# Patient Record
Sex: Female | Born: 1995 | Race: White | Hispanic: No | Marital: Single | Smoking: Never smoker
Health system: Southern US, Community
[De-identification: ages and names within clinical notes are randomized; demographics above are authoritative.]

## PROBLEM LIST (undated history)

## (undated) DIAGNOSIS — N200 Calculus of kidney: Secondary | ICD-10-CM

## (undated) DIAGNOSIS — J302 Other seasonal allergic rhinitis: Secondary | ICD-10-CM

## (undated) DIAGNOSIS — Z8619 Personal history of other infectious and parasitic diseases: Secondary | ICD-10-CM

## (undated) DIAGNOSIS — R519 Headache, unspecified: Secondary | ICD-10-CM

## (undated) DIAGNOSIS — N838 Other noninflammatory disorders of ovary, fallopian tube and broad ligament: Secondary | ICD-10-CM

## (undated) DIAGNOSIS — R1900 Intra-abdominal and pelvic swelling, mass and lump, unspecified site: Secondary | ICD-10-CM

## (undated) DIAGNOSIS — R55 Syncope and collapse: Secondary | ICD-10-CM

## (undated) DIAGNOSIS — R51 Headache: Secondary | ICD-10-CM

## (undated) HISTORY — DX: Other seasonal allergic rhinitis: J30.2

## (undated) HISTORY — DX: Syncope and collapse: R55

## (undated) HISTORY — PX: OTHER SURGICAL HISTORY: SHX169

## (undated) HISTORY — DX: Calculus of kidney: N20.0

## (undated) HISTORY — DX: Personal history of other infectious and parasitic diseases: Z86.19

## (undated) HISTORY — DX: Intra-abdominal and pelvic swelling, mass and lump, unspecified site: R19.00

## (undated) HISTORY — DX: Headache, unspecified: R51.9

## (undated) HISTORY — DX: Headache: R51

## (undated) HISTORY — PX: TYMPANOSTOMY TUBE PLACEMENT: SHX32

---
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
## 2016-01-12 DIAGNOSIS — M25532 Pain in left wrist: Secondary | ICD-10-CM | POA: Diagnosis not present

## 2016-04-05 ENCOUNTER — Encounter: Payer: Self-pay | Admitting: Family Medicine

## 2016-04-05 ENCOUNTER — Ambulatory Visit (INDEPENDENT_AMBULATORY_CARE_PROVIDER_SITE_OTHER): Payer: BLUE CROSS/BLUE SHIELD | Admitting: Family Medicine

## 2016-04-05 VITALS — BP 100/64 | HR 90 | Temp 98.5°F | Ht 66.0 in | Wt 165.0 lb

## 2016-04-05 DIAGNOSIS — S6992XD Unspecified injury of left wrist, hand and finger(s), subsequent encounter: Secondary | ICD-10-CM | POA: Diagnosis not present

## 2016-04-05 DIAGNOSIS — S6982XD Other specified injuries of left wrist, hand and finger(s), subsequent encounter: Secondary | ICD-10-CM | POA: Diagnosis not present
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
## 2016-04-05 NOTE — Progress Notes (Signed)
Pre visit review using our clinic review tool, if applicable. No additional management support is needed unless otherwise documented below in the visit note. 

## 2016-04-05 NOTE — Patient Instructions (Signed)

## 2016-04-10 ENCOUNTER — Ambulatory Visit: Payer: BLUE CROSS/BLUE SHIELD | Attending: Family Medicine | Admitting: Occupational Therapy

## 2016-04-10 DIAGNOSIS — M25632 Stiffness of left wrist, not elsewhere classified: Secondary | ICD-10-CM

## 2016-04-10 DIAGNOSIS — M79642 Pain in left hand: Secondary | ICD-10-CM | POA: Diagnosis not present

## 2016-04-10 DIAGNOSIS — M6281 Muscle weakness (generalized): Secondary | ICD-10-CM

## 2016-04-10 DIAGNOSIS — M25642 Stiffness of left hand, not elsewhere classified: Secondary | ICD-10-CM

## 2016-04-10 DIAGNOSIS — M25532 Pain in left wrist: Secondary | ICD-10-CM | POA: Diagnosis not present

## 2016-04-10 NOTE — Patient Instructions (Signed)
Contrast done  2-3 x day  Prefab thumb spica wear - and soft neoprene only when ADL's and on computer   AROM for thumb in all planes  Pain free range - RD and UD on table  Flexion and extention without gravity   Thumb PA and RA  Opposition  AROM pain free
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
## 2016-04-17 ENCOUNTER — Ambulatory Visit: Payer: BLUE CROSS/BLUE SHIELD | Attending: Family Medicine | Admitting: Occupational Therapy

## 2016-04-17 DIAGNOSIS — M25632 Stiffness of left wrist, not elsewhere classified: Secondary | ICD-10-CM | POA: Diagnosis not present

## 2016-04-17 DIAGNOSIS — M25642 Stiffness of left hand, not elsewhere classified: Secondary | ICD-10-CM | POA: Diagnosis not present

## 2016-04-17 DIAGNOSIS — M25532 Pain in left wrist: Secondary | ICD-10-CM | POA: Diagnosis not present

## 2016-04-17 DIAGNOSIS — M79642 Pain in left hand: Secondary | ICD-10-CM | POA: Diagnosis not present

## 2016-04-17 DIAGNOSIS — M6281 Muscle weakness (generalized): Secondary | ICD-10-CM

## 2016-04-17 NOTE — Patient Instructions (Addendum)
Add self range of motion/ PROM for pt to do at home UD, prayer stretch for wrist extention and wrist flexion over edge of table  10 reps keep pain under 3/10  Cont with contrast and splint wearing  And AROM

## 2016-04-17 NOTE — Therapy (Signed)
[facility]IC[facility] [address] [place], [place], [address] Phone: [phone]   Fax:  [phone]  Occupational Therapy Treatment  Patient Details  Name: Lori Blake MRN: [id number] Date of Birth: 1996-01-30 Referring Provider: Copland  Encounter Date: 04/17/2016      OT End of Session - 04/17/16 1519    Visit Number 2   Number of Visits 8   Date for OT Re-Evaluation 05/08/16   OT Start Time 1358   OT Stop Time 1445   OT Time Calculation (min) 47 min   Activity Tolerance Patient tolerated treatment well   Behavior During Therapy [facility] for tasks assessed/performed      Past Medical History:  Diagnosis Date  . Frequent headaches   . History of chicken pox   . History of RSV infection    as infant  . Kidney stones 2013  . Pelvic mass in female 2010   fallopian tube mass removed left side  . Seasonal allergies   . Syncopal episodes 2016   vasovagal    Past Surgical History:  Procedure Laterality Date  . LAPAROSCOPIC REMOVAL ABDOMINAL MASS Left 2010   paratubular - fallopian tube  . Nexplanon     insertion and removal  . TONSILLECTOMY  2006  . TYMPANOSTOMY TUBE PLACEMENT  1999; 2006   second surgery was permanent ear tubes    There were no vitals filed for this visit.      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 Yes   Pain Score 4    Pain Orientation Left   Pain Descriptors / Indicators Aching                      OT Treatments/Exercises (OP) - 04/17/16 0001      LUE Fluidotherapy   Number Minutes Fluidotherapy 10 Minutes   LUE Fluidotherapy Location Hand;Wrist   [facility] pain and increase  ROM at wrist and thumb       ROM assess and palpation - tender over 1st dorsal compartment and TFCC  Pain with UD and thumb - mostly at webspace and up in 2nd digit  Not 1st dorsal compartment  As well as pain increase with compression at wrist  AROM same as last time with pain 4/10 -   fluido therapy with AROM of wrist in all planes  Pain same - AROM increase 5 degrees  Was able to do AAROM and PROM by OT with increase pain for wrist UD,RD, flexion and extention  10 reps   Did some Graston tool nr 2 over volar wrist and forearm - tightness volar wrist  With some redness - and palm at base of thumb - sweeping and scooping  Increase wrist extention afterwards  Add self range of motion/ PROM for pt to do at home UD, prayer stretch for wrist extention and wrist flexion over edge of table  10 reps keep pain under 3/10  Cont with contrast and splint wearing              OT Education - 04/17/16 1519  Education provided Yes   Education Details add some PROM    Person(s) Educated Patient   Methods Explanation;Demonstration;Tactile cues;Verbal cues;Handout   Comprehension Verbal cues required;Returned demonstration;Verbalized understanding          OT Short Term Goals - 04/10/16 1615      OT SHORT TERM GOAL #1   Title Pain on PRWHE improve with at least 20 points in L wrist and hand   Baseline Pain eval 33/50 on PRWH   Time 3   Period Weeks   Status New     OT SHORT TERM GOAL #2   Title AROM for L wrist improve to same than R wrist to use in ADL's and typing without increase pain    Baseline L wrist flexion 48, ext 53, RD 25, UD 18    Time 4   Period Weeks   Status New     OT SHORT TERM GOAL #3   Title L thumb AROM improve to WNL without increase symptoms - to use in typing and buttons    Baseline thumb pain increase with RA and flexion    Time 3   Period Weeks   Status New           OT Long Term Goals - 04/10/16 1620      OT LONG TERM GOAL #1   Title  Initiate strengthening to L wrist in all planes with out increase pain or symptoms    Baseline pain at rest 6/10 and at the worse increase to 8/10    Time 4   Period Weeks   Status New               Plan - 04/17/16 1520    Clinical Impression Statement Pain is decrease this date - ROM same but was able to add some PROM to wrist without increase pain - add to HEP - still waiting for appt with ortho   Rehab Potential Good   OT Frequency 2x / week   OT Duration 4 weeks   OT Treatment/Interventions Self-care/ADL training;Fluidtherapy;Splinting;Patient/family education;Therapeutic exercises;Contrast Bath;Ultrasound;Iontophoresis;Parrafin;Manual Therapy;Passive range of motion   Plan assess progress with HEP , pain and if appt with ortho yet   OT Home Exercise Plan see pt instruction   Consulted and Agree with Plan of Care Patient      Patient will benefit from skilled therapeutic intervention in order to improve the following deficits and impairments:  Decreased range of motion, Impaired flexibility, Pain, Impaired UE functional use, Decreased strength  Visit Diagnosis: Pain in left hand  Pain in left wrist  Muscle weakness (generalized)  Stiffness of left wrist, not elsewhere classified  Stiffness of left hand, not elsewhere classified    Problem List Patient Active Problem List   Diagnosis Date Noted  . Left wrist injury 10/22/2015  . Left flank pain 10/22/2015  . Recurrent acute suppurative otitis media without spontaneous rupture of tympanic membrane of both sides 01/07/2015  . 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 Seasonal allergies   . Frequent headaches   . Pelvic mass in female     Lori Blake,Lori Blake 04/17/2016, 3:21 PM  [facility] PH[facility]TS MEDICINE [address], [place], [address] Phone: [phone]   Fax:  [phone]  Name: Lori Blake MRN:  [id number] Date of Birth: Jul 23, 1995

## 2016-04-20 ENCOUNTER — Ambulatory Visit: Payer: BLUE CROSS/BLUE SHIELD | Admitting: Occupational Therapy

## 2016-04-20 DIAGNOSIS — M25642 Stiffness of left hand, not elsewhere classified: Secondary | ICD-10-CM | POA: Diagnosis not present

## 2016-04-20 DIAGNOSIS — M25632 Stiffness of left wrist, not elsewhere classified: Secondary | ICD-10-CM | POA: Diagnosis not present

## 2016-04-20 DIAGNOSIS — M79642 Pain in left hand: Secondary | ICD-10-CM | POA: Diagnosis not present

## 2016-04-20 DIAGNOSIS — M25532 Pain in left wrist: Secondary | ICD-10-CM | POA: Diagnosis not present

## 2016-04-20 DIAGNOSIS — M6281 Muscle weakness (generalized): Secondary | ICD-10-CM | POA: Diagnosis not present

## 2016-04-20 NOTE — Patient Instructions (Signed)
Contrast - splint wearing  AROM pain free range for wrist in all planes Biofreeze 4 x day

## 2016-04-20 NOTE — Therapy (Signed)
[facility] [address] [place], [place], [address] Phone: [phone]   Fax:  [phone]  Occupational Therapy Treatment  Patient Details  Name: Lori Blake MRN: [id number] Date of Birth: 1995/12/19 Referring Provider: Patsy Lageropland  Encounter Date: 04/20/2016      OT End of Session - 04/20/16 1901    Visit Number 3   Number of Visits 8   Date for OT Re-Evaluation 05/08/16   OT Start Time 1804   OT Stop Time 1840   OT Time Calculation (min) 36 min   Activity Tolerance Patient tolerated treatment well   Behavior During Therapy [facility] for tasks assessed/performed      Past Medical History:  Diagnosis Date  . Frequent headaches   . History of chicken pox   . History of RSV infection    as infant  . Kidney stones 2013  . Pelvic mass in female 2010   fallopian tube mass removed left side  . Seasonal allergies   . Syncopal episodes 2016   vasovagal    Past Surgical History:  Procedure Laterality Date  . LAPAROSCOPIC REMOVAL ABDOMINAL MASS Left 2010   paratubular - fallopian tube  . Nexplanon     insertion and removal  . TONSILLECTOMY  2006  . TYMPANOSTOMY TUBE PLACEMENT  1999; 2006   second surgery was permanent ear tubes    There were no vitals filed for this visit.      Subjective Assessment - 04/20/16 1835    Subjective  Did the exercises - was hurting last night and tonight too - pain mostly on the bottom of my wrist - appt with Dr Tyna Jakschrtmann Tues 20th   Patient Stated Goals Want my wrist and hand to be like it was before the accident    Currently in Pain? Yes   Pain Score 7    Pain Location Wrist   Pain Orientation Left   Pain Descriptors / Indicators Aching;Shooting   Pain Type Acute pain   Pain Onset More than a month ago            [facility] OT Assessment - 04/20/16 0001      AROM   Left Wrist Extension 53 Degrees   Left Wrist Flexion 44 Degrees   Left Wrist Radial Deviation 30  Degrees   Left Wrist Ulnar Deviation 20 Degrees      Assess progress in AROM at wrist - tender and pain mostly on volar wrist and palm - done contrast and pt to cont with only AROM pain free range until appt with MD  Cont with thumb spica             OT Treatments/Exercises (OP) - 04/20/16 0001      LUE Contrast Bath   Time 11 minutes   Comments decrease pain after assessment or ROM - pain decrease to 4/10                OT Education - 04/20/16 1900    Education provided Yes   Education Details findings - and use of biofreeze - to only do AROM for wrist pain free range    Person(s) Educated Patient   Methods Explanation;Demonstration;Tactile cues   Comprehension Returned demonstration;Verbalized understanding          OT Short Term Goals - 04/10/16 1615      OT SHORT TERM GOAL #1   Title Pain on PRWHE improve with at least 20 points in L  wrist and hand   Baseline Pain eval 33/50 on PRWH   Time 3   Period Weeks   Status New     OT SHORT TERM GOAL #2   Title AROM for L wrist improve to same than R wrist to use in ADL's and typing without increase pain    Baseline L wrist flexion 48, ext 53, RD 25, UD 18    Time 4   Period Weeks   Status New     OT SHORT TERM GOAL #3   Title L thumb AROM improve to WNL without increase symptoms - to use in typing and buttons    Baseline thumb pain increase with RA and flexion    Time 3   Period Weeks   Status New           OT Long Term Goals - 04/10/16 1620      OT LONG TERM GOAL #1   Title Initiate strengthening to L wrist in all planes with out increase pain or symptoms    Baseline pain at rest 6/10 and at the worse increase to 8/10    Time 4   Period Weeks   Status New               Plan - 04/20/16 1901    Clinical Impression Statement Pt pain cont to be the same - with use of thumb spica and use of modalities - fluido, contrast - AROM pain free range - ROM did not improve - pt can use  biofreeze if needed and cont with splint and AROM pain free range until appt with MD  on 20th    Rehab Potential Good   OT Frequency 2x / week   OT Duration 4 weeks   OT Treatment/Interventions Self-care/ADL training;Fluidtherapy;Splinting;Patient/family education;Therapeutic exercises;Contrast Bath;Ultrasound;Iontophoresis;Parrafin;Manual Therapy;Passive range of motion   Plan Pt to contact after MD visit   OT Home Exercise Plan see pt instruction   Consulted and Agree with Plan of Care Patient      Patient will benefit from skilled therapeutic intervention in order to improve the following deficits and impairments:  Decreased range of motion, Impaired flexibility, Pain, Impaired UE functional use, Decreased strength  Visit Diagnosis: Pain in left hand  Pain in left wrist  Muscle weakness (generalized)  Stiffness of left wrist, not elsewhere classified  Stiffness of left hand, not elsewhere classified    Problem List Patient Active Problem List   Diagnosis Date Noted  . Left wrist injury 10/22/2015  . Left flank pain 10/22/2015  . Recurrent acute suppurative otitis media without spontaneous rupture of tympanic membrane of both sides 01/07/2015  . 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 Seasonal allergies   . Frequent headaches   . Pelvic mass in female     Oletta Cohn OTR/L,CLT 04/20/2016, 7:03 PM  [facility] [address], [place], [address] Phone: [phone]   Fax:  [phone]  Name: Lori Blake MRN: [id number] Date of Birth: [birth year]

## 2016-04-24 ENCOUNTER — Ambulatory Visit: Payer: BLUE CROSS/BLUE SHIELD | Admitting: Occupational Therapy

## 2016-04-27 ENCOUNTER — Encounter: Payer: BLUE CROSS/BLUE SHIELD | Admitting: Occupational Therapy

## 2016-04-30 DIAGNOSIS — H9201 Otalgia, right ear: Secondary | ICD-10-CM | POA: Diagnosis not present

## 2016-05-01 DIAGNOSIS — H66003 Acute suppurative otitis media without spontaneous rupture of ear drum, bilateral: Secondary | ICD-10-CM | POA: Diagnosis not present

## 2016-05-01 DIAGNOSIS — H6983 Other specified disorders of Eustachian tube, bilateral: Secondary | ICD-10-CM | POA: Diagnosis not present

## 2016-05-02 DIAGNOSIS — S63592A Other specified sprain of left wrist, initial encounter: Secondary | ICD-10-CM | POA: Diagnosis not present

## 2016-05-04 DIAGNOSIS — Z01419 Encounter for gynecological examination (general) (routine) without abnormal findings: Secondary | ICD-10-CM | POA: Diagnosis not present

## 2016-05-25 DIAGNOSIS — S63592A Other specified sprain of left wrist, initial encounter: Secondary | ICD-10-CM | POA: Diagnosis not present

## 2016-05-25 DIAGNOSIS — M659 Synovitis and tenosynovitis, unspecified: Secondary | ICD-10-CM | POA: Diagnosis not present

## 2016-05-25 DIAGNOSIS — M24832 Other specific joint derangements of left wrist, not elsewhere classified: Secondary | ICD-10-CM | POA: Diagnosis not present

## 2016-05-25 DIAGNOSIS — M24132 Other articular cartilage disorders, left wrist: Secondary | ICD-10-CM | POA: Diagnosis not present

## 2016-05-25 HISTORY — PX: WRIST SURGERY: SHX841

## 2016-05-30 ENCOUNTER — Ambulatory Visit: Payer: Self-pay | Admitting: Medical

## 2016-05-30 ENCOUNTER — Encounter: Payer: Self-pay | Admitting: Medical

## 2016-05-30 VITALS — BP 115/65 | HR 89 | Temp 98.6°F | Resp 16 | Ht 66.0 in | Wt 160.0 lb

## 2016-05-30 DIAGNOSIS — H6502 Acute serous otitis media, left ear: Secondary | ICD-10-CM

## 2016-05-30 MED ORDER — AZITHROMYCIN 250 MG PO TABS: ORAL_TABLET | ORAL | 0 refills | Status: DC

## 2016-05-30 NOTE — Progress Notes (Signed)
   Subjective:    Patient ID: Lori Blake, female    DOB: 1995-05-21, 21 y.o.   MRN: [id number]  HPI  21 yo old female started on Sunday with the feeling of fullnes and intermittent pain ( on pain killers for for your left wrist surgery 5 days ago , hydrocodone) in left ear. Left ear drained for x 20min , has not drained since. Now just painful and full feeling. Right ear also started feeling full as wel but no painl. Ran out of flonase last Thursday or Friday. Seen by ENT  Feb 19th placed on antibiotc eardrops and possibly a steroid pill or antibioitc patient not sure. Has tubes in ears. Patient states that last ear infection in  Feb 2018 was in her right ear only.    Review of Systems  Constitutional: Negative for chills and fever.  HENT: Positive for congestion, ear discharge and ear pain. Negative for hearing loss, postnasal drip, rhinorrhea, sinus pain, sore throat and tinnitus.   Eyes: Negative.   Respiratory: Negative.   Cardiovascular: Negative.   Neurological: Negative.        Objective:   Physical Exam  Constitutional: She appears well-developed and well-nourished.  HENT:  Head: Normocephalic and atraumatic.  Eyes: EOM are normal. Pupils are equal, round, and reactive to light.  Neck: Normal range of motion. Neck supple.  Cardiovascular: Normal rate, regular rhythm and normal heart sounds.    Left ear looks red with tube in place , no discharge noted. Right ear slightly dull with tube in place, no discharge noted.       Assessment & Plan:  Serous otitis media left ear with recent treatment by her ENT. Will try azithromycin 250mg  two tablets by mouth day 1 then one tablet days 2-5 , take with food. If worsening pain or pain does not improve to see her ENT doctor.  Return to the clinic as needed. Readjusted patient sling so left hand would be more elevated, fingers swollen, color within normal limits.

## 2016-05-30 NOTE — Patient Instructions (Signed)
Follow with your ENT doctor in 3-5 days if pain not improving , sooner If worsening pain. Avoid getting water into ears. Return to the clinic as needed.

## 2016-06-12 DIAGNOSIS — S63592D Other specified sprain of left wrist, subsequent encounter: Secondary | ICD-10-CM | POA: Diagnosis not present

## 2016-06-12 DIAGNOSIS — Z4789 Encounter for other orthopedic aftercare: Secondary | ICD-10-CM | POA: Diagnosis not present

## 2016-06-30 DIAGNOSIS — S63592D Other specified sprain of left wrist, subsequent encounter: Secondary | ICD-10-CM | POA: Diagnosis not present

## 2016-09-01 ENCOUNTER — Telehealth: Payer: Self-pay

## 2016-09-01 NOTE — Telephone Encounter (Signed)
Pt states she was in a few months ago & it was decided she would continue her OCP but skip her placebo pills. For the first few months, it was fine, but now she is spotting pretty heavily. Pt inquiring if she should discontinue & have a period now then restart new pack.

## 2016-09-01 NOTE — Telephone Encounter (Signed)
Spoke with patient regarding her birth control. She is going to continue taking only active pills and see what happens with next pack. She has been having spotting for a couple of days. She does admit increased stress currently.

## 2016-09-06 NOTE — Telephone Encounter (Signed)
Pt calling back.  She's been taking BC for a few months but doesn't take placebos so she doesn't have a period.  Last wk she started spotting then Fri she started bleeding.  It's been on and off since then -  Consistent bleed then stops then bleeds more every day.  She is still on active pills which she will finish this weekend.  Next week is placebo week.  Not sure if she should take active pills or take placebo pills and see if after that it gets fixed.  Not supposed to be bleeding.  Not sure what to do.  [phone]

## 2016-09-07 ENCOUNTER — Other Ambulatory Visit: Payer: Self-pay | Admitting: Advanced Practice Midwife

## 2016-09-07 NOTE — Telephone Encounter (Signed)
Spoke with patient and discussed options. Patient prefers to continue with this birth control and take placebo pills. She will let me know if she decides to switch pills.

## 2016-09-11 ENCOUNTER — Ambulatory Visit: Payer: BLUE CROSS/BLUE SHIELD | Admitting: Occupational Therapy

## 2016-09-12 ENCOUNTER — Ambulatory Visit: Payer: BLUE CROSS/BLUE SHIELD | Attending: Orthopedic Surgery | Admitting: Occupational Therapy

## 2016-09-12 DIAGNOSIS — M25642 Stiffness of left hand, not elsewhere classified: Secondary | ICD-10-CM | POA: Diagnosis not present

## 2016-09-12 DIAGNOSIS — M25632 Stiffness of left wrist, not elsewhere classified: Secondary | ICD-10-CM | POA: Insufficient documentation

## 2016-09-12 DIAGNOSIS — M25532 Pain in left wrist: Secondary | ICD-10-CM | POA: Insufficient documentation

## 2016-09-12 DIAGNOSIS — M6281 Muscle weakness (generalized): Secondary | ICD-10-CM | POA: Insufficient documentation

## 2016-09-12 DIAGNOSIS — M79642 Pain in left hand: Secondary | ICD-10-CM | POA: Insufficient documentation

## 2016-09-12 NOTE — Therapy (Signed)
[facility] [address], [place], [address] Phone: [phone]   Fax:  [phone]  Occupational Therapy Evaluation  Patient Details  Name: Lori Blake MRN: [id number] Date of Birth: July 07, 1995 Referring Provider: Melvyn Novas  Encounter Date: 09/12/2016      OT End of Session - 09/12/16 1529    Visit Number 1   Number of Visits 12   Date for OT Re-Evaluation 10/24/16   OT Start Time 1304   OT Stop Time 1349   OT Time Calculation (min) 45 min   Activity Tolerance Patient tolerated treatment well   Behavior During Therapy [facility] for tasks assessed/performed      Past Medical History:  Diagnosis Date  . Frequent headaches   . History of chicken pox   . History of RSV infection    as infant  . Kidney stones 2013  . Pelvic mass in female 2010   fallopian tube mass removed left side  . Seasonal allergies   . Syncopal episodes 2016   vasovagal    Past Surgical History:  Procedure Laterality Date  . LAPAROSCOPIC REMOVAL ABDOMINAL MASS Left 2010   paratubular - fallopian tube  . Nexplanon     insertion and removal  . TONSILLECTOMY  2006  . TYMPANOSTOMY TUBE PLACEMENT  1999; 2006   second surgery was permanent ear tubes    There were no vitals filed for this visit.      Subjective Assessment - 09/12/16 1514    Subjective  I had surgery on tear ligament at wrist April 15th - and then he took my last cast/brace off in June and refer to therapy - was busy until now - some pain with lifting ,pushing , pulling or twisting   Patient Stated Goals I want to be albe to use my hand and wrist like before the accident    Currently in Pain? No/denies           [facility] OT Assessment - 09/12/16 0001      Assessment   Diagnosis L TFCC repair, lunotriquetral tear   Referring Provider Melvyn Novas   Onset Date 05/25/16     Home  Environment   Lives With Friend(s)     Prior Function   Vocation Student   Leisure Landscape architect , likes to kayak, babysit and volunteer at sports      AROM   Right Wrist Extension 80 Degrees   Right Wrist Flexion 93 Degrees   Right Wrist Radial Deviation 25 Degrees   Right Wrist Ulnar Deviation 37 Degrees   Left Wrist Extension 47 Degrees   Left Wrist Flexion 55 Degrees   Left Wrist Radial Deviation 30 Degrees   Left Wrist Ulnar Deviation 20 Degrees     Strength   Right Hand Grip (lbs) 60   Right Hand Lateral Pinch 11 lbs   Right Hand 3 Point Pinch 11 lbs   Left Hand Grip (lbs) 30   Left Hand Lateral Pinch 6 lbs   Left Hand 3 Point Pinch 9 lbs        Fluidotherapy done prior to review of HEP - AROM for wrist in all planes done inside heat   HEP review and hand out provided   PROM for wrist flexion, ext, RD , UD  AROM in all planes  Teal putty for gripping , 3 point grip  12 reps  2-3 x day  No pain more than 1-2/10 and slight pull  OT Education - 09/12/16 1528    Education provided Yes   Education Details Results of eval , Homeprogram    Person(s) Educated Patient   Methods Explanation;Demonstration;Tactile cues;Verbal cues;Handout   Comprehension Verbal cues required;Returned demonstration;Verbalized understanding          OT Short Term Goals - 09/12/16 1534      OT SHORT TERM GOAL #1   Title Pain on PRWHE improve with at least 12  points in L wrist and hand   Baseline Pain eval 16/50 on PRWHE   Time 3   Period Weeks   Status New     OT SHORT TERM GOAL #2   Title AROM for L wrist improve to same than R wrist to use in ADL's and typing without increase pain    Baseline see flowsheet    Time 4   Period Weeks   Status New           OT Long Term Goals - 09/12/16 1536      OT LONG TERM GOAL #1   Title L wrist strength increase for pt to push up from chair, carry more than 8 lbs , turn doorknob ,pull or push chair without increase symptoms    Baseline pain 6/10 with pulling , twisting , pushing   -do not carry things in L more using R hand     Time 5   Period Weeks   Status New     OT LONG TERM GOAL #2   Title L grip and prehension strength increase to 75% compare to R to use hand in IADL's and ADL's without symptoms    Baseline Grip 60 R ; L 30 ; Lat grip R 11, L 6 lbs    Time 5   Period Weeks   Status New     OT LONG TERM GOAL #3   Title Function score on PRWHE improve with more than 5 lbs to carry 10 lbs, push up from chair, do sports without increase symptoms    Baseline Function score at eval  8.5/50    Time 6   Period Weeks   Status New               Plan - 09/12/16 1529    Clinical Impression Statement Pt present at eval more than 3 months out from L TFCC repair and lunotriquetral tear - pt out of splint or cast since June - pt show decrease ROM in wrist flexion, ext, RD , UD - and grip and prehension about 50% compare to R - pt report increase pian to 6/10 mostly with lifting ,pulling , pushing and twisting    Occupational performance deficits (Please refer to evaluation for details): ADL's;IADL's;Play;Leisure   Rehab Potential Good   OT Frequency Other (comment)  1-2 x wk   OT Duration 6 weeks   OT Treatment/Interventions Self-care/ADL training;Fluidtherapy;Patient/family education;Therapeutic exercises;Passive range of motion;Manual Therapy;Parrafin   Plan Assess progress with HEP    Clinical Decision Making Limited treatment options, no task modification necessary   OT Home Exercise Plan see pt instruction   Consulted and Agree with Plan of Care Patient      Patient will benefit from skilled therapeutic intervention in order to improve the following deficits and impairments:  Decreased range of motion, Impaired flexibility, Decreased strength, Impaired UE functional use, Pain  Visit Diagnosis: Pain in left wrist - Plan: Ot plan of care cert/re-cert  Stiffness of left wrist, not elsewhere classified - Plan: Ot  plan of care cert/re-cert  Muscle  weakness (generalized) - Plan: Ot plan of care cert/re-cert    Problem List Patient Active Problem List   Diagnosis Date Noted  . Left wrist injury 10/22/2015  . Left flank pain 10/22/2015  . Recurrent acute suppurative otitis media without spontaneous rupture of tympanic membrane of both sides 01/07/2015  . 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 Seasonal allergies   . Frequent headaches   . Pelvic mass in female     Oletta Cohn OTR/L,CLT 09/12/2016, 3:45 PM  [facility] [address], [place], [address] Phone: [phone]   Fax:  [phone]  Name: KANAE IGNATOWSKI MRN: [id number] Date of Birth: 1995/10/09

## 2016-09-12 NOTE — Patient Instructions (Signed)
Heat  PROM for wrist flexion, ext, RD , UD  AROM in all planes  Teal putty for gripping , 3 point grip  12 reps  2-3 x day  No pain more than 1-2/10 and slight pull

## 2016-09-15 ENCOUNTER — Ambulatory Visit: Payer: BLUE CROSS/BLUE SHIELD | Admitting: Occupational Therapy

## 2016-09-15 DIAGNOSIS — M25632 Stiffness of left wrist, not elsewhere classified: Secondary | ICD-10-CM | POA: Diagnosis not present

## 2016-09-15 DIAGNOSIS — M6281 Muscle weakness (generalized): Secondary | ICD-10-CM

## 2016-09-15 DIAGNOSIS — M79642 Pain in left hand: Secondary | ICD-10-CM | POA: Diagnosis not present

## 2016-09-15 DIAGNOSIS — M25532 Pain in left wrist: Secondary | ICD-10-CM

## 2016-09-15 DIAGNOSIS — M25642 Stiffness of left hand, not elsewhere classified: Secondary | ICD-10-CM | POA: Diagnosis not present

## 2016-09-15 NOTE — Therapy (Signed)
[facility] [address], [place], [address] Phone: [phone]   Fax:  [phone]  Occupational Therapy Treatment  Patient Details  Name: Lori Blake MRN: [id number] Date of Birth: April 21, 1995 Referring Provider: Melvyn Novas  Encounter Date: 09/15/2016      OT End of Session - 09/15/16 1543    Visit Number 2   Number of Visits 12   Date for OT Re-Evaluation 10/24/16   OT Start Time 1330   OT Stop Time 1401   OT Time Calculation (min) 31 min   Activity Tolerance Patient tolerated treatment well   Behavior During Therapy [facility] for tasks assessed/performed      Past Medical History:  Diagnosis Date  . Frequent headaches   . History of chicken pox   . History of RSV infection    as infant  . Kidney stones 2013  . Pelvic mass in female 2010   fallopian tube mass removed left side  . Seasonal allergies   . Syncopal episodes 2016   vasovagal    Past Surgical History:  Procedure Laterality Date  . LAPAROSCOPIC REMOVAL ABDOMINAL MASS Left 2010   paratubular - fallopian tube  . Nexplanon     insertion and removal  . TONSILLECTOMY  2006  . TYMPANOSTOMY TUBE PLACEMENT  1999; 2006   second surgery was permanent ear tubes    There were no vitals filed for this visit.      Subjective Assessment - 09/15/16 1531    Subjective  I was doing my exercises - doing okay - little more pain after it for few min than I had before I started exercises    Patient Stated Goals I want to be albe to use my hand and wrist like before the accident                       OT Treatments/Exercises (OP) - 09/15/16 0001      LUE Fluidotherapy   Number Minutes Fluidotherapy --   LUE Fluidotherapy Location Hand;Wrist  AROM for wrist in all planes to increase ROM and decreasepai      Measurements taken See flow sheet   Fluidotherapy  AROM for wrist in all planes - as SOC    Reinforce for pt to feel  slight pull and not more than 1-2/10 pain  Only use one finger for PROM wrist flexion - showed great progress since 3 days ago PROM for wrist flexion, ext, RD , UD  Review needed mod A   AROM in all planes  Teal putty for gripping , 3 point grip  12 reps  2-3 x day  No pain more than 1-2/10 and only  slight pull              OT Education - 09/15/16 1543    Education provided Yes   Education Details reinforce to keep at slight pull or stretch    Person(s) Educated Patient   Methods Explanation;Demonstration;Tactile cues;Verbal cues;Handout   Comprehension Verbal cues required;Returned demonstration;Verbalized understanding          OT Short Term Goals - 09/12/16 1534      OT SHORT TERM GOAL #1   Title Pain on PRWHE improve with at least 12  points in L wrist and hand   Baseline Pain eval 16/50 on PRWHE   Time 3   Period Weeks   Status New     OT SHORT TERM GOAL #  2   Title AROM for L wrist improve to same than R wrist to use in ADL's and typing without increase pain    Baseline see flowsheet    Time 4   Period Weeks   Status New           OT Long Term Goals - 09/12/16 1536      OT LONG TERM GOAL #1   Title L wrist strength increase for pt to push up from chair, carry more than 8 lbs , turn doorknob ,pull or push chair without increase symptoms    Baseline pain 6/10 with pulling , twisting , pushing  -do not carry things in L more using R hand     Time 5   Period Weeks   Status New     OT LONG TERM GOAL #2   Title L grip and prehension strength increase to 75% compare to R to use hand in IADL's and ADL's without symptoms    Baseline Grip 60 R ; L 30 ; Lat grip R 11, L 6 lbs    Time 5   Period Weeks   Status New     OT LONG TERM GOAL #3   Title Function score on PRWHE improve with more than 5 lbs to carry 10 lbs, push up from chair, do sports without increase symptoms    Baseline Function score at eval  8.5/50    Time 6   Period Weeks   Status New                Plan - 09/15/16 1544    Clinical Impression Statement Pt showed great progress in ROM for wrist since only 3 days ago - pt to keep HEP at gentle or slight pull - and less than 1-2/10 pain - cont with same HEP    Occupational performance deficits (Please refer to evaluation for details): ADL's;IADL's;Play;Leisure   Rehab Potential Good   OT Frequency 1x / week   OT Duration 4 weeks   OT Treatment/Interventions Self-care/ADL training;Fluidtherapy;Patient/family education;Therapeutic exercises;Passive range of motion;Manual Therapy;Parrafin   Plan assess if can start 1 lbs and upgrade putty    OT Home Exercise Plan see pt instruction   Consulted and Agree with Plan of Care Patient      Patient will benefit from skilled therapeutic intervention in order to improve the following deficits and impairments:  Decreased range of motion, Impaired flexibility, Decreased strength, Impaired UE functional use, Pain  Visit Diagnosis: Pain in left wrist  Stiffness of left wrist, not elsewhere classified  Muscle weakness (generalized)    Problem List Patient Active Problem List   Diagnosis Date Noted  . Left wrist injury 10/22/2015  . Left flank pain 10/22/2015  . Recurrent acute suppurative otitis media without spontaneous rupture of tympanic membrane of both sides 01/07/2015  . 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 Seasonal allergies   . Frequent headaches   . Pelvic mass in female     Oletta CohnDuPreez, Airica Schwartzkopf OTR/L,CLT 09/15/2016, 3:46 PM  [facility] [address] [place], [place], [address] Phone: [phone]   Fax:  [phone]  Name: Lori Blake MRN: [id number] Date of Birth: 05-19-1995

## 2016-09-15 NOTE — Patient Instructions (Addendum)
Same HEP  

## 2016-09-18 ENCOUNTER — Ambulatory Visit: Payer: BLUE CROSS/BLUE SHIELD | Admitting: Occupational Therapy

## 2016-09-18 DIAGNOSIS — M25532 Pain in left wrist: Secondary | ICD-10-CM | POA: Diagnosis not present

## 2016-09-18 DIAGNOSIS — M6281 Muscle weakness (generalized): Secondary | ICD-10-CM

## 2016-09-18 DIAGNOSIS — M25642 Stiffness of left hand, not elsewhere classified: Secondary | ICD-10-CM | POA: Diagnosis not present

## 2016-09-18 DIAGNOSIS — M25632 Stiffness of left wrist, not elsewhere classified: Secondary | ICD-10-CM

## 2016-09-18 DIAGNOSIS — M79642 Pain in left hand: Secondary | ICD-10-CM | POA: Diagnosis not present

## 2016-09-18 NOTE — Patient Instructions (Addendum)
Cont with PROM  16oz hammer wrist in all planes 12 reps  Pain free  Can increase to 2 sets - 2  X day - in 3 days if no pain  And 3 sets in 6 days if not increase pain   Upgrade to green putty for gripping , 3 point grip  12 reps  2 x day  No pain more than 1-2/10 and only  slight pull  Can do same upgrade for reps as 16 oz hammer

## 2016-09-18 NOTE — Therapy (Signed)
[facility] [address], [place], [address] Phone: [phone]   Fax:  [phone]  Occupational Therapy Treatment  Patient Details  Name: Lori Blake MRN: [id number] Date of Birth: 05-05-1995 Referring Provider: Melvyn Novas  Encounter Date: 09/18/2016      OT End of Session - 09/18/16 1213    Visit Number 3   Number of Visits 12   Date for OT Re-Evaluation 10/24/16   OT Start Time 0918   OT Stop Time 0951   OT Time Calculation (min) 33 min   Activity Tolerance Patient tolerated treatment well   Behavior During Therapy [facility] for tasks assessed/performed      Past Medical History:  Diagnosis Date  . Frequent headaches   . History of chicken pox   . History of RSV infection    as infant  . Kidney stones 2013  . Pelvic mass in female 2010   fallopian tube mass removed left side  . Seasonal allergies   . Syncopal episodes 2016   vasovagal    Past Surgical History:  Procedure Laterality Date  . LAPAROSCOPIC REMOVAL ABDOMINAL MASS Left 2010   paratubular - fallopian tube  . Nexplanon     insertion and removal  . TONSILLECTOMY  2006  . TYMPANOSTOMY TUBE PLACEMENT  1999; 2006   second surgery was permanent ear tubes    There were no vitals filed for this visit.      Subjective Assessment - 09/18/16 1037    Subjective  Did not had pain - exercises did okay -I [place] - but my dad drove   Patient Stated Goals I want to be albe to use my hand and wrist like before the accident    Currently in Pain? No/denies            [facility] OT Assessment - 09/18/16 0001      AROM   Left Wrist Extension 58 Degrees   Left Wrist Flexion 75 Degrees     Strength   Right Hand Lateral Pinch 15 lbs   Right Hand 3 Point Pinch 13 lbs   Left Hand Grip (lbs) 34   Left Hand Lateral Pinch 14 lbs   Left Hand 3 Point Pinch 11 lbs                  OT  Treatments/Exercises (OP) - 09/18/16 0001      LUE Fluidotherapy   Number Minutes Fluidotherapy 10 Minutes   LUE Fluidotherapy Location Hand;Wrist  AROM at [facility] to increase flexin and extention       Measurements taken for wrist ext and flexion - grip/prehension  See flow sheet   Fluidotherapy  AROM for wrist in all planes - as SOC    Reinforce for pt to only  feel slight pull and not more than 1-2/10 pain   PROM for wrist flexion, ext  16oz hammer wrist in all planes 12 reps  Pain free  Can increase to 2 sets - 2  X day - in 3 days if no pain  And 3 sets in 6 days if not increase pain   Upgrade to green putty for gripping , 3 point grip  12 reps  2 x day  No pain more than 1-2/10 and only  slight pull  Can do same upgrade for reps as 16 oz hammer  OT Education - 09/18/16 1213    Education provided Yes   Education Details upgrade and add Clinical cytogeneticist) Educated Patient   Methods Explanation;Demonstration;Tactile cues;Verbal cues;Handout   Comprehension Verbal cues required;Returned demonstration;Verbalized understanding          OT Short Term Goals - 09/12/16 1534      OT SHORT TERM GOAL #1   Title Pain on PRWHE improve with at least 12  points in L wrist and hand   Baseline Pain eval 16/50 on PRWHE   Time 3   Period Weeks   Status New     OT SHORT TERM GOAL #2   Title AROM for L wrist improve to same than R wrist to use in ADL's and typing without increase pain    Baseline see flowsheet    Time 4   Period Weeks   Status New           OT Long Term Goals - 09/12/16 1536      OT LONG TERM GOAL #1   Title L wrist strength increase for pt to push up from chair, carry more than 8 lbs , turn doorknob ,pull or push chair without increase symptoms    Baseline pain 6/10 with pulling , twisting , pushing  -do not carry things in L more using R hand     Time 5   Period Weeks   Status New     OT LONG TERM GOAL #2   Title L grip and  prehension strength increase to 75% compare to R to use hand in IADL's and ADL's without symptoms    Baseline Grip 60 R ; L 30 ; Lat grip R 11, L 6 lbs    Time 5   Period Weeks   Status New     OT LONG TERM GOAL #3   Title Function score on PRWHE improve with more than 5 lbs to carry 10 lbs, push up from chair, do sports without increase symptoms    Baseline Function score at eval  8.5/50    Time 6   Period Weeks   Status New               Plan - 09/18/16 1214    Clinical Impression Statement Pt cont to show progress in ROM , strength and  decrease pain - and increase use - add weight for wrist and upgrade putty for grip - cont to progress    Occupational performance deficits (Please refer to evaluation for details): ADL's;IADL's;Play;Leisure   Rehab Potential Good   OT Frequency 1x / week   OT Duration 4 weeks   OT Treatment/Interventions Self-care/ADL training;Fluidtherapy;Patient/family education;Therapeutic exercises;Passive range of motion;Manual Therapy;Parrafin   Plan upgrade to 2 lbs if possible and add new putty HEP    Clinical Decision Making Limited treatment options, no task modification necessary   OT Home Exercise Plan see pt instruction   Consulted and Agree with Plan of Care Patient      Patient will benefit from skilled therapeutic intervention in order to improve the following deficits and impairments:  Decreased range of motion, Impaired flexibility, Decreased strength, Impaired UE functional use, Pain  Visit Diagnosis: Stiffness of left wrist, not elsewhere classified  Pain in left wrist  Muscle weakness (generalized)    Problem List Patient Active Problem List   Diagnosis Date Noted  . Left wrist injury 10/22/2015  . Left flank pain 10/22/2015  . Recurrent acute suppurative otitis media without  spontaneous rupture of tympanic membrane of both sides 01/07/2015  . 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 Seasonal  allergies   . Frequent headaches   . Pelvic mass in female     Oletta CohnDuPreez, Lilienne Weins OTR/L,CLT 09/18/2016, 12:18 PM  [facility] [address] [place], [place], [address] Phone: [phone]   Fax:  [phone]  Name: Lori Blake MRN: [id number] Date of Birth: 1995-09-24

## 2016-09-27 ENCOUNTER — Ambulatory Visit: Payer: BLUE CROSS/BLUE SHIELD | Admitting: Occupational Therapy

## 2016-09-27 DIAGNOSIS — M25532 Pain in left wrist: Secondary | ICD-10-CM | POA: Diagnosis not present

## 2016-09-27 DIAGNOSIS — M25642 Stiffness of left hand, not elsewhere classified: Secondary | ICD-10-CM

## 2016-09-27 DIAGNOSIS — M6281 Muscle weakness (generalized): Secondary | ICD-10-CM

## 2016-09-27 DIAGNOSIS — M79642 Pain in left hand: Secondary | ICD-10-CM

## 2016-09-27 DIAGNOSIS — M25632 Stiffness of left wrist, not elsewhere classified: Secondary | ICD-10-CM

## 2016-09-27 NOTE — Therapy (Signed)
[facility] [address] [place], [place], [address] Phone: [phone]   Fax:  [phone]  Occupational Therapy Treatment  Patient Details  Name: Lori Blake MRN: [id number] Date of Birth: Jun 07, 1995 Referring Provider: Melvyn Novasrtmann  Encounter Date: 09/27/2016      OT End of Session - 09/27/16 0939    Visit Number 4   Number of Visits 12   Date for OT Re-Evaluation 10/24/16   OT Start Time 0916   OT Stop Time 1000   OT Time Calculation (min) 44 min   Activity Tolerance Patient tolerated treatment well   Behavior During Therapy [facility] for tasks assessed/performed      Past Medical History:  Diagnosis Date  . Frequent headaches   . History of chicken pox   . History of RSV infection    as infant  . Kidney stones 2013  . Pelvic mass in female 2010   fallopian tube mass removed left side  . Seasonal allergies   . Syncopal episodes 2016   vasovagal    Past Surgical History:  Procedure Laterality Date  . LAPAROSCOPIC REMOVAL ABDOMINAL MASS Left 2010   paratubular - fallopian tube  . Nexplanon     insertion and removal  . TONSILLECTOMY  2006  . TYMPANOSTOMY TUBE PLACEMENT  1999; 2006   second surgery was permanent ear tubes    There were no vitals filed for this visit.      Subjective Assessment - 09/27/16 0923    Subjective  My wrist pain is good- using it more- still some pain at the top of wrist for flexoin stretch - doing okay with weight and putty -still cannot push me up   Patient Stated Goals I want to be albe to use my hand and wrist like before the accident    Currently in Pain? No/denies            [facility] - 09/27/16 0001      AROM   Left Wrist Extension 65 Degrees   Left Wrist Flexion 83 Degrees     Strength   Right Hand Grip (lbs) 60   Right Hand Lateral Pinch 15 lbs   Right Hand 3 Point Pinch 13 lbs   Left Hand Grip (lbs) 49   Left Hand Lateral Pinch 16 lbs   Left Hand 3 Point Pinch 12 lbs                  OT Treatments/Exercises (OP) - 09/27/16 0001      LUE Fluidotherapy   Number Minutes Fluidotherapy 10 Minutes   LUE Fluidotherapy Location Hand;Wrist  AROM for flexion and extention prior to ther ex      Measurements taken for wrist ext and flexion - grip/prehension  Great progress See flow sheet  Fluidotherapy AROM for wrist in all planes - as SOC   Reinforce for pt to only  feel slight pull and not more than 1-2/10 pain   PROM for wrist flexion, ext  Add table slide for wrist extention and pressure  2 lbs for  wrist in all planes  15 reps   Pain free  Can increase to 2 sets - 2  X day - in 3-4 days if no pain    Mix 1/2 dark blue firm into  green putty for gripping , 3 point grip , lat grip  12 reps  2 x day  No pain more than 1-2/10 and only  slight pull  Can increase to 2 sets in 3-4 days - and add pulling and twisting              OT Education - 09/27/16 0939    Education provided Yes   Education Details upgrade putty - and 2 lbs    Person(s) Educated Patient   Methods Explanation;Demonstration;Tactile cues;Verbal cues   Comprehension Verbal cues required;Returned demonstration;Verbalized understanding          OT Short Term Goals - 09/12/16 1534      OT SHORT TERM GOAL #1   Title Pain on PRWHE improve with at least 12  points in L wrist and hand   Baseline Pain eval 16/50 on PRWHE   Time 3   Period Weeks   Status New     OT SHORT TERM GOAL #2   Title AROM for L wrist improve to same than R wrist to use in ADL's and typing without increase pain    Baseline see flowsheet    Time 4   Period Weeks   Status New           OT Long Term Goals - 09/12/16 1536      OT LONG TERM GOAL #1   Title L wrist strength increase for pt to push up from chair, carry more than 8 lbs , turn doorknob ,pull or push chair without increase symptoms    Baseline pain 6/10 with pulling , twisting ,  pushing  -do not carry things in L more using R hand     Time 5   Period Weeks   Status New     OT LONG TERM GOAL #2   Title L grip and prehension strength increase to 75% compare to R to use hand in IADL's and ADL's without symptoms    Baseline Grip 60 R ; L 30 ; Lat grip R 11, L 6 lbs    Time 5   Period Weeks   Status New     OT LONG TERM GOAL #3   Title Function score on PRWHE improve with more than 5 lbs to carry 10 lbs, push up from chair, do sports without increase symptoms    Baseline Function score at eval  8.5/50    Time 6   Period Weeks   Status New               Plan - 09/27/16 1047    Clinical Impression Statement Pt cont to make great progress in ROM at wrist , grip and prehension greatly - and able to increase to 2 lbs for wrist - pt to cont with HEP and upgrade as informed    Occupational performance deficits (Please refer to evaluation for details): ADL's;IADL's;Play;Leisure   Rehab Potential Good   OT Frequency 1x / week   OT Duration 2 weeks   OT Treatment/Interventions Self-care/ADL training;Fluidtherapy;Patient/family education;Therapeutic exercises;Passive range of motion;Manual Therapy;Parrafin   Plan assess progress using 2 lbs mix putty    Clinical Decision Making Limited treatment options, no task modification necessary   OT Home Exercise Plan see pt instruction   Consulted and Agree with Plan of Care Patient      Patient will benefit from skilled therapeutic intervention in order to improve the following deficits and impairments:  Decreased range of motion, Impaired flexibility, Decreased strength, Impaired UE functional use, Pain  Visit Diagnosis: Stiffness of left wrist, not elsewhere classified  Pain in left wrist  Muscle weakness (generalized)  Pain  in left hand  Stiffness of left hand, not elsewhere classified    Problem List Patient Active Problem List   Diagnosis Date Noted  . Left wrist injury 10/22/2015  . Left flank pain  10/22/2015  . Recurrent acute suppurative otitis media without spontaneous rupture of tympanic membrane of both sides 01/07/2015  . 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 Seasonal allergies   . Frequent headaches   . Pelvic mass in female     Oletta Cohn OTR/L,CLT 09/27/2016, 1:30 PM  [facility] PHYSICAL AND SPORTS MEDICINE [address], [place], [address] Phone: [phone]   Fax:  [phone]  Name: Lori Blake MRN: [id number] Date of Birth: 05-15-1995

## 2016-09-27 NOTE — Patient Instructions (Addendum)
M  Reinforce for pt to only  feel slight pull and not more than 1-2/10 pain   Add table slide for wrist extention and pressure  2 lbs for  wrist in all planes upgrade 15 reps   Pain free  Can increase to 2 sets - 2  X day - in 3-4 days if no pain    Mix 1/2 dark blue firm into  green putty for gripping , 3 point grip , lat grip  12 reps  2 x day  No pain more than 1-2/10 and only slight pull  Can increase to 2 sets in 3-4 days - and add pulling and twisting

## 2016-10-03 ENCOUNTER — Ambulatory Visit: Payer: Self-pay | Admitting: Medical

## 2016-10-03 ENCOUNTER — Encounter: Payer: Self-pay | Admitting: Medical

## 2016-10-03 VITALS — BP 100/64 | HR 85 | Temp 97.6°F | Resp 16 | Ht 66.0 in | Wt 157.0 lb

## 2016-10-03 DIAGNOSIS — J0110 Acute frontal sinusitis, unspecified: Secondary | ICD-10-CM

## 2016-10-03 MED ORDER — AMOXICILLIN-POT CLAVULANATE 875-125 MG PO TABS: 1.0000 | ORAL_TABLET | Freq: Two times a day (BID) | ORAL | 0 refills | Status: DC

## 2016-10-03 NOTE — Progress Notes (Signed)
   Subjective:    Patient ID: Lori FleetAshley M Alomar, female    DOB: 05-31-1995, 21 y.o.   MRN: [id number]  HPI 21 yo female started 6 days ago with sore throat then  nasal congestion with PND cough. Runny nose and stuffy nose followed. Slept during the weekend thought it was a cold, got worse on Monday. Today sore throat and cough continues. Taking a OTC cold and sinus, didn't help, so tried OTC alternating Motrin and Tylenol. Cough dry , nothing comes up.  Wrist surgery on the left by Dr. Orlan Leavensrtman in March still in physical therapy, doing well so far.   Review of Systems  Constitutional: Positive for chills and fever.  HENT: Positive for congestion, postnasal drip, rhinorrhea, sinus pain, sinus pressure, sneezing, sore throat and voice change. Negative for ear pain and hearing loss.   Eyes: Negative for discharge and itching.  Respiratory: Positive for cough. Negative for shortness of breath and wheezing.   Cardiovascular: Negative for chest pain, palpitations and leg swelling.  Gastrointestinal: Negative for abdominal pain.  Endocrine: Negative for polydipsia, polyphagia and polyuria.  Genitourinary: Negative for dysuria and hematuria.  Musculoskeletal: Negative for back pain and myalgias.  Skin: Negative for rash.  Allergic/Immunologic: Positive for environmental allergies and food allergies.  Neurological: Positive for headaches. Negative for syncope and light-headedness.  Hematological: Negative for adenopathy.  Psychiatric/Behavioral: Negative for agitation, behavioral problems, confusion and hallucinations.   Pressure in ears bilaterally   forehead and maxillary sinus pressure and pain. Objective:   Physical Exam  Constitutional: She is oriented to person, place, and time. She appears well-developed and well-nourished.  HENT:  Head: Normocephalic and atraumatic.  Right Ear: External ear normal.  Left Ear: External ear normal.  Nose: Mucosal edema and rhinorrhea present. Right sinus  exhibits frontal sinus tenderness. Left sinus exhibits frontal sinus tenderness.  Mouth/Throat: Uvula is midline and oropharynx is clear and moist.  Eyes: Pupils are equal, round, and reactive to light. Conjunctivae and EOM are normal.  Neck: Normal range of motion. Neck supple.  Cardiovascular: Normal rate and regular rhythm.  Exam reveals no gallop and no friction rub.   No murmur heard. Pulmonary/Chest: Effort normal and breath sounds normal.  Neurological: She is alert and oriented to person, place, and time.  Skin: Skin is warm and dry.  Psychiatric: She has a normal mood and affect. Her behavior is normal. Judgment and thought content normal.  Nursing note and vitals reviewed.   Tubes in ears bilaterally      Assessment & Plan:  Sinusitis e prescribed Augmentin 875-125mg  one by mouth twice daily x 10 days #20 no refills , take with foodl Get OTC flonasse 2 sprays each notstril once a day for nasal congestion. Continue OTC Mortin or Tylenol as needed for headache, take as directed.  Recommended  OTC Zyrtec 10mg  one by mouth daily . Rest and drink plenty of water. Return in 3-5 days if not improving.

## 2016-10-03 NOTE — Patient Instructions (Signed)
Take Augmentin as prescribed.  Return to the clinic in k 3-5 days if not improving. Rest and drink plenty of water. Continue to use OTC Motrin and /or Tylenol for headaches, take as directed.    Sinusitis, Adult Sinusitis is soreness and inflammation of your sinuses. Sinuses are hollow spaces in the bones around your face. They are located:  Around your eyes.  In the middle of your forehead.  Behind your nose.  In your cheekbones.  Your sinuses and nasal passages are lined with a stringy fluid (mucus). Mucus normally drains out of your sinuses. When your nasal tissues get inflamed or swollen, the mucus can get trapped or blocked so air cannot flow through your sinuses. This lets bacteria, viruses, and funguses grow, and that leads to infection. Follow these instructions at home: Medicines  Take, use, or apply over-the-counter and prescription medicines only as told by your doctor. These may include nasal sprays.  If you were prescribed an antibiotic medicine, take it as told by your doctor. Do not stop taking the antibiotic even if you start to feel better. Hydrate and Humidify  Drink enough water to keep your pee (urine) clear or pale yellow.  Use a cool mist humidifier to keep the humidity level in your home above 50%.  Breathe in steam for 10-15 minutes, 3-4 times a day or as told by your doctor. You can do this in the bathroom while a hot shower is running.  Try not to spend time in cool or dry air. Rest  Rest as much as possible.  Sleep with your head raised (elevated).  Make sure to get enough sleep each night. General instructions  Put a warm, moist washcloth on your face 3-4 times a day or as told by your doctor. This will help with discomfort.  Wash your hands often with soap and water. If there is no soap and water, use hand sanitizer.  Do not smoke. Avoid being around people who are smoking (secondhand smoke).  Keep all follow-up visits as told by your  doctor. This is important. Contact a doctor if:  You have a fever.  Your symptoms get worse.  Your symptoms do not get better within 10 days. Get help right away if:  You have a very bad headache.  You cannot stop throwing up (vomiting).  You have pain or swelling around your face or eyes.  You have trouble seeing.  You feel confused.  Your neck is stiff.  You have trouble breathing. This information is not intended to replace advice given to you by your health care provider. Make sure you discuss any questions you have with your health care provider. Document Released: 08/16/2007 Document Revised: 10/24/2015 Document Reviewed: 12/23/2014 Elsevier Interactive Patient Education  [employer]2018 [employer]

## 2016-10-04 ENCOUNTER — Ambulatory Visit: Payer: BLUE CROSS/BLUE SHIELD | Admitting: Occupational Therapy

## 2016-10-04 DIAGNOSIS — M6281 Muscle weakness (generalized): Secondary | ICD-10-CM | POA: Diagnosis not present

## 2016-10-04 DIAGNOSIS — M25532 Pain in left wrist: Secondary | ICD-10-CM | POA: Diagnosis not present

## 2016-10-04 DIAGNOSIS — M25632 Stiffness of left wrist, not elsewhere classified: Secondary | ICD-10-CM | POA: Diagnosis not present

## 2016-10-04 DIAGNOSIS — M79642 Pain in left hand: Secondary | ICD-10-CM | POA: Diagnosis not present

## 2016-10-04 DIAGNOSIS — M25642 Stiffness of left hand, not elsewhere classified: Secondary | ICD-10-CM | POA: Diagnosis not present

## 2016-10-04 NOTE — Patient Instructions (Signed)
Same HEP but flexion stretch to wrist with loose fist  1 lbs for wrist flexion and extention  But cont 2 lbs for sup/pro, RD ,UD  But focus 3rd MC in line with midline of forearm during wrist extention  2 x day

## 2016-10-04 NOTE — Therapy (Signed)
[facility] [address] [place], [place], [address] Phone: [phone]   Fax:  [phone]  Occupational Therapy Treatment  Patient Details  Name: Lori Blake Turnbo MRN: [id number] Date of Birth: Apr 27, 1995 Referring Provider: Melvyn Novasrtmann  Encounter Date: 10/04/2016      OT End of Session - 10/04/16 1501    Visit Number 5   Number of Visits 12   Date for OT Re-Evaluation 10/24/16   OT Start Time 1144   OT Stop Time 1215   OT Time Calculation (min) 31 min   Activity Tolerance Patient tolerated treatment well   Behavior During Therapy [facility] for tasks assessed/performed      Past Medical History:  Diagnosis Date  . Frequent headaches   . History of chicken pox   . History of RSV infection    as infant  . Kidney stones 2013  . Pelvic mass in female 2010   fallopian tube mass removed left side  . Seasonal allergies   . Syncopal episodes 2016   vasovagal    Past Surgical History:  Procedure Laterality Date  . LAPAROSCOPIC REMOVAL ABDOMINAL MASS Left 2010   paratubular - fallopian tube  . Nexplanon     insertion and removal  . TONSILLECTOMY  2006  . TYMPANOSTOMY TUBE PLACEMENT  1999; 2006   second surgery was permanent ear tubes    There were no vitals filed for this visit.      Subjective Assessment - 10/04/16 1458    Subjective  Doing okay - no pain with using my hand - but some pain with 2 lbs wrist up and down - but then it goes away    Patient Stated Goals I want to be albe to use my hand and wrist like before the accident    Currently in Pain? No/denies                      OT Treatments/Exercises (OP) - 10/04/16 0001      LUE Fluidotherapy   Number Minutes Fluidotherapy 8 Minutes   LUE Fluidotherapy Location Hand;Wrist  AROM for wrist in all planes to increase ROM       Measurements taken for wrist ext and flexion -about the same  But increase after fluido  Fluidotherapy  AROM for wrist in all planes - as SOC   Reinforce for pt to only feel slight pull and not more than 1-2/10 pain   PROM for wrist flexion with hand in loose fist   extention PROM for wrist  Cont with  slide for wrist extentio 2 lbs for  sup/pro, RD and UD   but need t.c and v/c for hand in midline - 3 rd MC   15 reps   Pain free  Decrease wrist extention to end range isometric and then 1 lbs weight  12 reps   2 X day - in 3-4 days if no pain  increase to 2 lbs   Mix 1/2 dark blue firm into  green putty for gripping , 3 point grip , lat grip  12 reps  2 x day  No pain more than 1-2/10 and only slight pull  Can increase to 2 sets in 3-4 days - and add pulling and twisting        Same HEP but flexion stretch to wrist with loose fist  1 lbs for wrist flexion and extention  But cont 2 lbs for sup/pro, RD ,UD  But focus 3rd MC in line with midline of forearm during wrist extention  2 x day        OT Education - 10/04/16 1500    Education provided Yes   Education Details HEP modify  decrease weight for wrist extention   Person(s) Educated Patient   Methods Explanation;Demonstration;Tactile cues;Verbal cues   Comprehension Verbalized understanding;Returned demonstration;Verbal cues required          OT Short Term Goals - 09/12/16 1534      OT SHORT TERM GOAL #1   Title Pain on PRWHE improve with at least 12  points in L wrist and hand   Baseline Pain eval 16/50 on PRWHE   Time 3   Period Weeks   Status New     OT SHORT TERM GOAL #2   Title AROM for L wrist improve to same than R wrist to use in ADL's and typing without increase pain    Baseline see flowsheet    Time 4   Period Weeks   Status New           OT Long Term Goals - 09/12/16 1536      OT LONG TERM GOAL #1   Title L wrist strength increase for pt to push up from chair, carry more than 8 lbs , turn doorknob ,pull or push chair without increase symptoms    Baseline pain 6/10 with pulling  , twisting , pushing  -do not carry things in L more using R hand     Time 5   Period Weeks   Status New     OT LONG TERM GOAL #2   Title L grip and prehension strength increase to 75% compare to R to use hand in IADL's and ADL's without symptoms    Baseline Grip 60 R ; L 30 ; Lat grip R 11, L 6 lbs    Time 5   Period Weeks   Status New     OT LONG TERM GOAL #3   Title Function score on PRWHE improve with more than 5 lbs to carry 10 lbs, push up from chair, do sports without increase symptoms    Baseline Function score at eval  8.5/50    Time 6   Period Weeks   Status New               Plan - 10/04/16 1506    Clinical Impression Statement Pt making progress in strenght and functional use - had some pain with wrist ext, flexion with 2 lbs - decrease weight and add isometric end range - but pain decrease after HEP - pt to cont with HEP for 2 wks    Occupational performance deficits (Please refer to evaluation for details): ADL's;IADL's;Play;Leisure   Rehab Potential Good   OT Frequency 1x / week   OT Treatment/Interventions Self-care/ADL training;Fluidtherapy;Patient/family education;Therapeutic exercises;Passive range of motion;Manual Therapy;Parrafin   Plan assess progress in strenghth    Clinical Decision Making Limited treatment options, no task modification necessary   OT Home Exercise Plan see pt instruction   Consulted and Agree with Plan of Care Patient      Patient will benefit from skilled therapeutic intervention in order to improve the following deficits and impairments:  Decreased range of motion, Impaired flexibility, Decreased strength, Impaired UE functional use, Pain  Visit Diagnosis: Stiffness of left wrist, not elsewhere classified  Pain in left wrist  Muscle weakness (generalized)    Problem List Patient Active Problem List  Diagnosis Date Noted  . Left wrist injury 10/22/2015  . Left flank pain 10/22/2015  . Recurrent acute suppurative  otitis media without spontaneous rupture of tympanic membrane of both sides 01/07/2015  . 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 Seasonal allergies   . Frequent headaches   . Pelvic mass in female     Oletta CohnDuPreez, Mckinnon Glick OTR/L,CLT 10/04/2016, 3:08 PM  [facility] [address] [facility] [address] [place], [place], [address] Phone: [phone]   Fax:  [phone]  Name: Lori Blake Smigelski MRN: [id number] Date of Birth: 1995/04/21

## 2016-10-12 DIAGNOSIS — S63592D Other specified sprain of left wrist, subsequent encounter: Secondary | ICD-10-CM | POA: Diagnosis not present

## 2016-10-20 ENCOUNTER — Ambulatory Visit: Payer: BLUE CROSS/BLUE SHIELD | Attending: Orthopedic Surgery | Admitting: Occupational Therapy

## 2016-10-20 DIAGNOSIS — M25632 Stiffness of left wrist, not elsewhere classified: Secondary | ICD-10-CM

## 2016-10-20 DIAGNOSIS — M6281 Muscle weakness (generalized): Secondary | ICD-10-CM | POA: Insufficient documentation

## 2016-10-20 DIAGNOSIS — M25532 Pain in left wrist: Secondary | ICD-10-CM | POA: Insufficient documentation

## 2016-10-20 NOTE — Therapy (Signed)
[place] [facility] [address], [place], [address] Phone: [phone]   Fax:  [phone]  Occupational Therapy Treatment/discharge   Patient Details  Name: Lori Blake MRN: [id number] Date of Birth: 1996/02/06 Referring Provider: Caralyn Guile  Encounter Date: 10/20/2016      OT End of Session - 10/20/16 1403    Visit Number 6   Number of Visits 6   Date for OT Re-Evaluation 10/20/16   OT Start Time 0932   OT Stop Time 1012   OT Time Calculation (min) 40 min   Activity Tolerance Patient tolerated treatment well   Behavior During Therapy [facility] for tasks assessed/performed      Past Medical History:  Diagnosis Date  . Frequent headaches   . History of chicken pox   . History of RSV infection    as infant  . Kidney stones 2013  . Pelvic mass in female 2010   fallopian tube mass removed left side  . Seasonal allergies   . Syncopal episodes 2016   vasovagal    Past Surgical History:  Procedure Laterality Date  . LAPAROSCOPIC REMOVAL ABDOMINAL MASS Left 2010   paratubular - fallopian tube  . Nexplanon     insertion and removal  . TONSILLECTOMY  2006  . TYMPANOSTOMY TUBE PLACEMENT  1999; 2006   second surgery was permanent ear tubes    There were no vitals filed for this visit.      Subjective Assessment - 10/20/16 0936    Subjective  Seen Dr Caralyn Guile - happy progress- can cont at home - about 70-80% I am thinking - still only some pain when picking up something heavy with only L hand    Patient Stated Goals I want to be albe to use my hand and wrist like before the accident    Currently in Pain? No/denies            [facility] OT Assessment - 10/20/16 0001      AROM   Left Wrist Extension 70 Degrees     Strength   Right Hand Grip (lbs) 60   Right Hand Lateral Pinch 18 lbs   Right Hand 3 Point Pinch 15 lbs   Left Hand Grip (lbs) 52   Left Hand Lateral Pinch 16 lbs   Left Hand 3 Point Pinch  13.5 lbs      assess ment done and PRWHE for pain 11/50 and function 6.5/50   Review her putty HEP  To cont with and then upgrade her HEP for wrist to bands  +/-  Yellow about 2.5 lbs , Red 4 lbs , green 5 lbs  Done and add to HEP for pt to increase gradually resistance and sets    Change from 2 lbs for wrist  YTB for wrist in all planes - ed on HEP And can increase in few days -sets  And then resistance  To RTB   and then again later on to GTB  Also can do RTB starting out for elbow flexion and extention  And upgrade later to green   pt demo all in clinic - no pain   Pt demo and verbalize understanding for HEP and how to upgrade                      OT Education - 10/20/16 1403    Education provided Yes   Education Details HEP and discharge instructions    Person(s)  Educated Patient   Methods Explanation;Demonstration;Tactile cues;Verbal cues;Handout   Comprehension Verbal cues required;Returned demonstration;Verbalized understanding          OT Short Term Goals - 10/20/16 1405      OT SHORT TERM GOAL #1   Title Pain on PRWHE improve with at least 12  points in L wrist and hand   Baseline Pain eval 16/50 on PRWHE and now 11/50   Status Partially Met     OT SHORT TERM GOAL #2   Title AROM for L wrist improve to same than R wrist to use in ADL's and typing without increase pain    Baseline  still decrease in flexion and extention last 10 degrees - but no increase Pain with functional use    Status Partially Met     OT SHORT TERM GOAL #3   Title L thumb AROM improve to WNL without increase symptoms - to use in typing and buttons    Status Achieved           OT Long Term Goals - 10/20/16 1406      OT LONG TERM GOAL #1   Title L wrist strength increase for pt to push up from chair, carry more than 8 lbs , turn doorknob ,pull or push chair without increase symptoms    Baseline can do except lifting heavy objects - can carry them    Status  Achieved     OT LONG TERM GOAL #2   Title L grip and prehension strength increase to 75% compare to R to use hand in IADL's and ADL's without symptoms    Status Achieved     OT LONG TERM GOAL #3   Title Function score on PRWHE improve with more than 5 lbs to carry 10 lbs, push up from chair, do sports without increase symptoms    Baseline Function score at eval  8.5/50  and now 6.5 - can carry weight but not lift yet -    Status Partially Met               Plan - 10/20/16 1403    Clinical Impression Statement Pt made great progress in ROM , strength - still some pain but more with lifting heavy objecs - pt seen MD and following up in 2-3 months - can cont at home with graded HEP - pt ed on  HEP and to increase it on her own - with  increase resistance bands - pt discharge  with HEP    Occupational performance deficits (Please refer to evaluation for details): ADL's;IADL's;Play;Leisure   OT Treatment/Interventions Self-care/ADL training;Fluidtherapy;Patient/family education;Therapeutic exercises;Passive range of motion;Manual Therapy;Parrafin   Plan discharge with HEP    Clinical Decision Making Limited treatment options, no task modification necessary   OT Home Exercise Plan see pt instruction   Consulted and Agree with Plan of Care Patient      Patient will benefit from skilled therapeutic intervention in order to improve the following deficits and impairments:     Visit Diagnosis: Stiffness of left wrist, not elsewhere classified  Pain in left wrist  Muscle weakness (generalized)    Problem List Patient Active Problem List   Diagnosis Date Noted  . Left wrist injury 10/22/2015  . Left flank pain 10/22/2015  . Recurrent acute suppurative otitis media without spontaneous rupture of tympanic membrane of both sides 01/07/2015  . 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 Seasonal allergies   . Frequent headaches   .  Pelvic mass in female      Rosalyn Gess OTR/L,CLT 10/20/2016, 2:08 PM  [place] [facility] [address], [place], [address] Phone: [phone]   Fax:  [phone]  Name: Lori Blake MRN: [id number] Date of Birth: [birth year]

## 2016-10-20 NOTE — Patient Instructions (Signed)
Pt to cont with putty - mix for grip  Change from 2 lbs for wrist  YTB for wrist in all planes - ed on HEP for  All planes  And can increase in few days -if up to 2 sets  To RTB   and then again later on to GTB  Also can do RTB starting out for elbow flexion and extention  And upgrade later to green   pt demo all in clinic - no pain

## 2016-11-03 ENCOUNTER — Encounter: Payer: Self-pay | Admitting: Maternal Newborn

## 2016-11-03 ENCOUNTER — Ambulatory Visit (INDEPENDENT_AMBULATORY_CARE_PROVIDER_SITE_OTHER): Payer: BLUE CROSS/BLUE SHIELD | Admitting: Maternal Newborn

## 2016-11-03 VITALS — BP 120/80 | HR 88 | Ht 66.0 in | Wt 157.0 lb

## 2016-11-03 DIAGNOSIS — N760 Acute vaginitis: Secondary | ICD-10-CM | POA: Diagnosis not present

## 2016-11-03 LAB — POCT WET PREP (WET MOUNT)
CLUE CELLS WET PREP WHIFF POC: NEGATIVE
Trichomonas Wet Prep HPF POC: ABSENT

## 2016-11-03 MED ORDER — FLUCONAZOLE 150 MG PO TABS: 150.0000 mg | ORAL_TABLET | Freq: Once | ORAL | 1 refills | Status: AC

## 2016-11-03 NOTE — Patient Instructions (Signed)

## 2016-11-03 NOTE — Progress Notes (Signed)
Obstetrics & Gynecology Office Visit   Chief Complaint: Vaginal itching and irritation; vulvar irritation, itching and redness.   History of Present Illness: Ms. Lori Blake is a 21 y.o. No obstetric history on file. who LMP was Patient's last menstrual period was 09/01/2016., presents today for a problem visit.   Patient complains of an abnormal vaginal discharge for several days. Discharge described as: white, thick and odorless. Vaginal symptoms include discharge described as white and vulvar erythema noted, local irritation and vulvar itching.Vulvar symptoms include local irritation and vulvar itching.STI Risk: Very low risk of STD exposure.   Other associated symptoms: none.  Contraception: OCP (estrogen/progesterone).  She admits to recent antibiotic exposure, denies changes in soaps, detergents coinciding with the onset of her symptoms.  She has not previously self treated or been under treatment by another provider for these symptoms.    Review of Systems: Genito-Urinary ROS: positive for - vulvar/vaginal symptoms: itching, pain  Review of Systems  Constitutional: Negative.   All other systems reviewed and are negative.   Past Medical History:  Past Medical History:  Diagnosis Date  . Frequent headaches   . History of chicken pox   . History of RSV infection    as infant  . Kidney stones 2013  . Pelvic mass in female 2010   fallopian tube mass removed left side  . Seasonal allergies   . Syncopal episodes 2016   vasovagal    Past Surgical History:  Past Surgical History:  Procedure Laterality Date  . LAPAROSCOPIC REMOVAL ABDOMINAL MASS Left 2010   paratubular - fallopian tube  . Nexplanon     insertion and removal  . TONSILLECTOMY  2006  . TYMPANOSTOMY TUBE PLACEMENT  1999; 2006   second surgery was permanent ear tubes    Gynecologic History: Patient's last menstrual period was 09/01/2016.  Obstetric History: No obstetric history on file.  Family History:   Family History  Problem Relation Age of Onset  . Cancer Other        breast - multiple family members  . Melanoma Sister   . Cancer Paternal Grandmother        thyroid - and great grandfather  . CAD Maternal Grandfather   . Heart disease Paternal Grandfather   . Diabetes Paternal Grandfather   . Other Brother        hypoglycemia from hyperinsulinism  . Stroke Other        maternal great grandmother    Social History:  Social History   Social History  . Marital status: Single    Spouse name: N/A  . Number of children: N/A  . Years of education: N/A   Occupational History  . Not on file.   Social History Main Topics  . Smoking status: Never Smoker  . Smokeless tobacco: Never Used  . Alcohol use No  . Drug use: No  . Sexual activity: No   Other Topics Concern  . Not on file   Social History Narrative   [employer] freshman.    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    Allergies:  Allergies  Allergen Reactions  . Pediacare Children [Acetaminophen] Hives    ? Orange dye; Ok with tylenol  . Shellfish Allergy     Allergy at  21 yo and seafood was positive allergy.  . Versed [Midazolam] Other (See Comments)    Paradoxical reaction  Medications: Prior to Admission medications   Medication Sig Start Date End Date Taking? Authorizing Provider  amoxicillin-clavulanate (AUGMENTIN) 875-125 MG tablet Take 1 tablet by mouth 2 (two) times daily. Take with food 10/03/16   Ratcliffe, Heather R, PA-C  norethindrone-ethinyl estradiol-iron (MICROGESTIN FE,GILDESS FE,LOESTRIN FE) 1.5-30 MG-MCG tablet Take 1 tablet by mouth daily.    [provider]    Physical Exam Vitals:  Vitals:   11/03/16 0826  BP: 120/80  Pulse: 88   Patient's last menstrual period was 09/01/2016.  General: NAD HEENT: normocephalic, anicteric Genitourinary:  External: Vulvar erythema noted  Rectal:  deferred  Lymphatic: no evidence of inguinal lymphadenopathy Neurologic: Grossly intact Psychiatric: mood appropriate, affect full   Assessment: 21 y.o. No obstetric history on file presents with vaginal and vulvar pain, itching, and thick, white discharge. She has recently taken antibiotics. Few yeast buds and hyphae seen on wet mount.  Plan: Problem List Items Addressed This Visit    None    Visit Diagnoses    Acute vaginitis    -  Primary   Relevant Medications   fluconazole (DIFLUCAN) 150 MG tablet   Other Relevant Orders   Urine Culture   POCT Wet Prep (Wet Mount)     Fluconazole rx given. Pt advised that she can use OTC cream such as Monistat for vulvar itching.   Return to care if symptoms do not improve following medication therapy.  Marcelyn Bruins, CNM

## 2016-11-05 LAB — URINE CULTURE

## 2016-11-08 ENCOUNTER — Telehealth: Payer: Self-pay

## 2016-11-08 NOTE — Telephone Encounter (Signed)
Pt was told to call if sxs haven't improved.  Does she need a refill of medication or does she need to be seen?  [phone]

## 2016-11-08 NOTE — Telephone Encounter (Signed)
Please advise 

## 2016-11-08 NOTE — Telephone Encounter (Signed)
If she took 2 doses of fluconazole she would need a follow up to figure out why it didn't work.  If she only took one, she can take another (and I can reorder if needed). Thanks!

## 2016-11-08 NOTE — Telephone Encounter (Signed)
Message left for patient to call me back.

## 2016-11-09 ENCOUNTER — Ambulatory Visit: Payer: BLUE CROSS/BLUE SHIELD | Admitting: Obstetrics and Gynecology

## 2016-11-09 NOTE — Telephone Encounter (Signed)
Patient is called back due to a missed call from a nurse. Please advise

## 2016-11-09 NOTE — Telephone Encounter (Signed)
Pt called after hour nurse stating that she spoke c a nurse earlier a yeast infection and was requesting to speak c her again to let her know what she decided.  She did not want to be seen but did want to make appt in am .  Wanted to know what to do about the abd pain.  See after hour nurse notes.  I called pt.  She hasn't taken the 2nd diflucan and states her abd hurts around the belly button area.  She has had pain there before.  Adv per JS response to yesterday's telephone encounter to go ahead and take the 2nd diflucan and if not better to call for appt next week.  Unable to sched pt d/t her school schedule even though was told we could give her a note.  Adv abd pain could possibly be a side effect of diflucan I didn't know for sure.

## 2016-11-10 ENCOUNTER — Ambulatory Visit: Payer: BLUE CROSS/BLUE SHIELD | Admitting: Advanced Practice Midwife

## 2016-11-10 NOTE — Telephone Encounter (Signed)
I was out of office yesterday and was unable to call pt back. I left her a message telling her JYS message. She is to call us if pharmacy does not have medication refill that was called in last week.

## 2016-11-10 NOTE — Telephone Encounter (Signed)
Pt called me back and stated she picked the refill up yesterday. I told her if she was still having S&S to call back next week to be seen. She voiced an understanding. KJ CMA

## 2016-11-23 ENCOUNTER — Ambulatory Visit (INDEPENDENT_AMBULATORY_CARE_PROVIDER_SITE_OTHER): Payer: BLUE CROSS/BLUE SHIELD | Admitting: Advanced Practice Midwife

## 2016-11-23 ENCOUNTER — Encounter: Payer: Self-pay | Admitting: Advanced Practice Midwife

## 2016-11-23 VITALS — BP 110/60 | HR 84 | Ht 66.0 in | Wt 159.0 lb

## 2016-11-23 DIAGNOSIS — B3731 Acute candidiasis of vulva and vagina: Secondary | ICD-10-CM

## 2016-11-23 DIAGNOSIS — B373 Candidiasis of vulva and vagina: Secondary | ICD-10-CM | POA: Diagnosis not present

## 2016-11-23 MED ORDER — FLUCONAZOLE 150 MG PO TABS: 150.0000 mg | ORAL_TABLET | Freq: Once | ORAL | 3 refills | Status: DC

## 2016-11-23 NOTE — Progress Notes (Signed)
S: The patient is here today with recurring symptoms of yeast infection. She was treated with 2 doses of diflucan about 2 weeks ago for similar symptoms which seemed to improve and then about 4 days ago the symptoms returned. She has had some irritation and itching and a small amount of discharge. The patient is not sexually active and she has never had a speculum exam.   O: Vital Signs: BP 110/60   Pulse 84   Ht 5\' 6"  (1.676 m)   Wt 159 lb (72.1 kg)   LMP 09/01/2016 (Exact Date)   BMI 25.66 kg/m  Constitutional: Well nourished, well developed female in no acute distress.  HEENT: normal Skin: Warm and dry.  Cardiovascular: Regular rate and rhythm.   Extremity: no edema  Respiratory: Clear to auscultation bilateral. Normal respiratory effort Psych: Alert and Oriented x3. No memory deficits. Normal mood and affect.  MS: normal gait, normal bilateral lower extremity ROM/strength/stability.  Pelvic exam:  is not limited by body habitus EGBUS: some redness, appears irritated Vagina: attempted speculum exam but patient unable to tolerate, specimen collected by swab only Cervix: not evaluated  Wet Prep: positive for hyphae  A: 21 yo with vaginal yeast infection  P: Diflucan Rx Wet Prep Nuswab vaginitis  Tresea MallJane Nehemiah Montee, CNM

## 2016-11-28 LAB — NUSWAB VAGINITIS PLUS (VG+)
CANDIDA ALBICANS, NAA: POSITIVE — AB
CANDIDA GLABRATA, NAA: NEGATIVE
Chlamydia trachomatis, NAA: NEGATIVE
Neisseria gonorrhoeae, NAA: NEGATIVE
TRICH VAG BY NAA: NEGATIVE

## 2016-12-08 ENCOUNTER — Telehealth: Payer: Self-pay | Admitting: Advanced Practice Midwife

## 2016-12-08 NOTE — Telephone Encounter (Signed)
Returned call to patient and discussed other treatment options.

## 2016-12-08 NOTE — Telephone Encounter (Signed)
Patient returning call from Erskine Squibb, she states she still has some discomfort but not as bad, she took both meds that were given but infection has not completely gone away.

## 2016-12-11 ENCOUNTER — Encounter: Payer: Self-pay | Admitting: Advanced Practice Midwife

## 2016-12-12 ENCOUNTER — Telehealth: Payer: Self-pay

## 2016-12-12 NOTE — Telephone Encounter (Signed)
Pt has f/u questions for JEG.  [phone].

## 2016-12-14 ENCOUNTER — Other Ambulatory Visit: Payer: Self-pay | Admitting: Advanced Practice Midwife

## 2016-12-14 DIAGNOSIS — B3731 Acute candidiasis of vulva and vagina: Secondary | ICD-10-CM

## 2016-12-14 DIAGNOSIS — B373 Candidiasis of vulva and vagina: Secondary | ICD-10-CM

## 2016-12-14 MED ORDER — FLUCONAZOLE 150 MG PO TABS: 150.0000 mg | ORAL_TABLET | ORAL | 0 refills | Status: AC

## 2016-12-14 NOTE — Progress Notes (Signed)
Rx sent for diflucan 150 mg q week x 4 weeks for persistent vaginal yeast infection.

## 2016-12-15 ENCOUNTER — Other Ambulatory Visit: Payer: Self-pay | Admitting: Advanced Practice Midwife

## 2016-12-15 DIAGNOSIS — B373 Candidiasis of vulva and vagina: Secondary | ICD-10-CM

## 2016-12-15 DIAGNOSIS — B3731 Acute candidiasis of vulva and vagina: Secondary | ICD-10-CM

## 2017-01-09 DIAGNOSIS — S63592D Other specified sprain of left wrist, subsequent encounter: Secondary | ICD-10-CM | POA: Diagnosis not present

## 2017-01-20 ENCOUNTER — Other Ambulatory Visit: Payer: Self-pay | Admitting: Certified Nurse Midwife

## 2017-01-20 ENCOUNTER — Telehealth: Payer: Self-pay | Admitting: Advanced Practice Midwife

## 2017-01-20 MED ORDER — NORETHIN ACE-ETH ESTRAD-FE 1.5-30 MG-MCG PO TABS: 1.0000 | ORAL_TABLET | Freq: Every day | ORAL | 1 refills | Status: DC

## 2017-01-22 NOTE — Telephone Encounter (Signed)
Called and lvm for pt to call back to be schedule for Annual

## 2017-01-22 NOTE — Telephone Encounter (Signed)
Pt called after hour nurse for refill which CLG called in.   Pt needs to sched annual exam.  [phone]

## 2017-01-26 ENCOUNTER — Other Ambulatory Visit: Payer: Self-pay | Admitting: Obstetrics & Gynecology

## 2017-01-26 ENCOUNTER — Ambulatory Visit (INDEPENDENT_AMBULATORY_CARE_PROVIDER_SITE_OTHER): Payer: BLUE CROSS/BLUE SHIELD | Admitting: Advanced Practice Midwife

## 2017-01-26 VITALS — BP 112/70 | Ht 66.0 in | Wt 154.0 lb

## 2017-01-26 DIAGNOSIS — N898 Other specified noninflammatory disorders of vagina: Secondary | ICD-10-CM

## 2017-01-26 DIAGNOSIS — R51 Headache: Secondary | ICD-10-CM

## 2017-01-26 DIAGNOSIS — R519 Headache, unspecified: Secondary | ICD-10-CM

## 2017-01-26 MED ORDER — PREDNISONE 10 MG (48) PO TBPK: ORAL_TABLET | ORAL | 0 refills | Status: DC

## 2017-01-26 MED ORDER — NORETHIN ACE-ETH ESTRAD-FE 1.5-30 MG-MCG PO TABS: 1.0000 | ORAL_TABLET | Freq: Every day | ORAL | 2 refills | Status: DC

## 2017-01-26 MED ORDER — NORETHIN ACE-ETH ESTRAD-FE 1.5-30 MG-MCG PO TABS: 1.0000 | ORAL_TABLET | Freq: Every day | ORAL | 12 refills | Status: DC

## 2017-01-29 ENCOUNTER — Telehealth: Payer: Self-pay

## 2017-01-29 ENCOUNTER — Encounter: Payer: Self-pay | Admitting: Advanced Practice Midwife

## 2017-01-29 NOTE — Progress Notes (Signed)
S: The patient is here today with request for refill of birth control pill since she takes continuous active pills. She takes the hormones to treat her chronic headaches. She also has a complaint of similar complaint of vaginitis as she has had in the last 2 months. Her main symptom currently is itching for the last 3 days. The itching is primarily external but sometimes has a quality of feeling deeper in the skin. She has a small amount of thin mostly clear discharge. She also has some generalized irritation. She has been using only gentle body care products and detergents. She has been doing the previously recommended sea salt bath or apple cider vinegar bath. She still does not want to use OTC boric acid treatment due to her intolerance of vaginal penetration. She agrees to an exam today with q-tip only for collecting a specimen. Discussion of other possibilities for vaginal itching including chemicals, STDs, hormones, autoimmune disorders, generalized anxiety. She avoids use of chemicals/perfumes. She is not sexually active and not at risk for STDs. She denies anxiety disorder. We will try a course of prednisone to see if that helps her itching. We also discussed again the possibility of pursuing pelvic PT.   O: Vital Signs: BP 112/70   Ht 5\' 6"  (1.676 m)   Wt 154 lb (69.9 kg)   BMI 24.86 kg/m  Constitutional: Well nourished, well developed female in no acute distress.  HEENT: normal Skin: Warm and dry.  Cardiovascular: Regular rate and rhythm.   Respiratory: Clear to auscultation bilateral. Normal respiratory effort Psych: Alert and Oriented x3. No memory deficits. Normal mood and affect.  MS: normal gait, normal bilateral lower extremity ROM/strength/stability.  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

## 2017-01-29 NOTE — Telephone Encounter (Signed)
Pt called after hour nurse 01/26/17 5:15pm stating rx not at pharm.  After hour nurse paged PH who resent eRx.

## 2017-02-22 ENCOUNTER — Encounter: Payer: Self-pay | Admitting: Adult Health

## 2017-02-22 ENCOUNTER — Ambulatory Visit: Payer: BLUE CROSS/BLUE SHIELD | Admitting: Adult Health

## 2017-02-22 VITALS — BP 100/64 | HR 74 | Temp 98.9°F | Resp 16 | Ht 66.0 in | Wt 154.0 lb

## 2017-02-22 DIAGNOSIS — H6502 Acute serous otitis media, left ear: Secondary | ICD-10-CM

## 2017-02-22 DIAGNOSIS — Z9622 Myringotomy tube(s) status: Secondary | ICD-10-CM

## 2017-02-22 DIAGNOSIS — Z0000 Encounter for general adult medical examination without abnormal findings: Secondary | ICD-10-CM

## 2017-02-22 MED ORDER — AMOXICILLIN-POT CLAVULANATE 875-125 MG PO TABS: 1.0000 | ORAL_TABLET | Freq: Two times a day (BID) | ORAL | 0 refills | Status: DC

## 2017-02-22 NOTE — Progress Notes (Signed)
Subjective:     Patient ID: Lori FleetAshley M Breed, female   DOB: 09-21-1995, 21 y.o.   MRN: [id number]  HPI   Patient is a 21 year old female in no acute distress with pain in her left  ear, she reports seeing liquid yellow drainage from her ear this morning. Left ear pain started yesterday. She reports fullness in her right ear since last week. She also  had sinus drainage last week that resolved that included runny nose and post nasal drip.   She is a patient of Dr. Jenne CampusMcQueen at [facility]. She reports she still has tubes in bilateral ears since she was 21 years old.  She reports last ear infection was February in one of her ears she does not know which one and she was seen at ENT.   Patient  denies any fever, chills, rash, chest pain, shortness of breath, nausea, vomiting, or diarrhea.   Patient's last menstrual period was 09/27/2016. She is on BCP skipping placebo weak. She denies any chance of pregnancy. No activity.    Review of Systems  Constitutional: Negative.  Negative for activity change.  HENT: Positive for ear discharge and ear pain. Negative for congestion, dental problem, drooling, facial swelling, hearing loss, mouth sores, nosebleeds, postnasal drip, rhinorrhea, sinus pressure, sinus pain, sneezing, sore throat, tinnitus, trouble swallowing and voice change.   Eyes: Negative.   Respiratory: Negative.   Cardiovascular: Negative.   Gastrointestinal: Negative.   Endocrine: Negative.   Genitourinary: Negative.   Musculoskeletal: Negative.   Skin: Negative.   Allergic/Immunologic: Negative.   Neurological: Negative.   Hematological: Negative.   Psychiatric/Behavioral: Negative.        Objective:   Physical Exam  Constitutional: She is oriented to person, place, and time. Vital signs are normal. She appears well-developed and well-nourished.  Non-toxic appearance. She does not have a sickly appearance. She does not appear ill. No distress.  HENT:  Head: Normocephalic and  atraumatic.  Right Ear: Hearing and ear canal normal. There is drainage (mild yellow drainage around in place ear tube ).  Left Ear: There is drainage (mild yellow ear drainage round in place ear tube). Tympanic membrane is erythematous. Left ear perforated TM: by ear tube   Nose: Right sinus exhibits no maxillary sinus tenderness and no frontal sinus tenderness. Left sinus exhibits no maxillary sinus tenderness and no frontal sinus tenderness.  Mouth/Throat: Uvula is midline, oropharynx is clear and moist and mucous membranes are normal. Mucous membranes are not pale, not dry and not cyanotic. No oropharyngeal exudate, posterior oropharyngeal edema, posterior oropharyngeal erythema or tonsillar abscesses.  Bilateral ear tubes she sees E[facility] - [facility]   Eyes: Conjunctivae, EOM and lids are normal. Pupils are equal, round, and reactive to light. Right eye exhibits no discharge. Left eye exhibits no discharge. No scleral icterus.  Neck: Trachea normal, normal range of motion, full passive range of motion without pain and phonation normal. Neck supple. No JVD present. No tracheal deviation present.  Cardiovascular: Normal rate, regular rhythm, normal heart sounds and intact distal pulses. Exam reveals no gallop, no distant heart sounds and no friction rub.  No murmur heard. Pulmonary/Chest: Effort normal and breath sounds normal. No stridor. No respiratory distress. She has no wheezes. She has no rales. She exhibits no tenderness.  Abdominal: Soft. Bowel sounds are normal.  Musculoskeletal: Normal range of motion.  Lymphadenopathy:    She has no cervical adenopathy.    She has no axillary adenopathy.  Neurological: She is  alert and oriented to person, place, and time. She has normal reflexes.  Patient moves on and off of exam table and in room without difficulty. Gait is normal in hall and in room. Patient is oriented to person place time and situation. Patient answers questions appropriately and  engages in conversation.   Skin: Skin is warm, dry and intact. No rash noted. She is not diaphoretic. No pallor.  Psychiatric: She has a normal mood and affect. Her speech is normal and behavior is normal. Judgment and thought content normal. Cognition and memory are normal.  Nursing note and vitals reviewed.      Assessment:     Acute serous otitis media of left ear, recurrence not specified  Routine check-up - Plan: CANCELED: POCT urine pregnancy  History of placement of ear tubes      Plan:     Meds ordered this encounter  Medications  . amoxicillin-clavulanate (AUGMENTIN) 875-125 MG tablet    Sig: Take 1 tablet by mouth 2 (two) times daily.    Dispense:  20 tablet    Refill:  0    Advised to return to clinic for an appointment if no improvement within 72 hours or if any symptoms change or worsen. Advised ER or urgent Care if after hours or on weekend. 911 for emergency symptoms at any time.  She will schedule appointment with ENT for follow up as well.  She is advised she needs to follow up with [facility] within one month even if she improves.  Patient verbalized understanding of all instructions given and denies any further questions at this time.

## 2017-02-22 NOTE — Patient Instructions (Signed)
Otitis Media, Adult Otitis media is redness, soreness, and puffiness (swelling) in the space just behind your eardrum (middle ear). It may be caused by allergies or infection. It often happens along with a cold. Follow these instructions at home:  Take your medicine as told. Finish it even if you start to feel better.  Only take over-the-counter or prescription medicines for pain, discomfort, or fever as told by your doctor.  Follow up with your doctor as told. Contact a doctor if:  You have otitis media only in one ear, or bleeding from your nose, or both.  You notice a lump on your neck.  You are not getting better in 3-5 days.  You feel worse instead of better. Get help right away if:  You have pain that is not helped with medicine.  You have puffiness, redness, or pain around your ear.  You get a stiff neck.  You cannot move part of your face (paralysis).  You notice that the bone behind your ear hurts when you touch it. This information is not intended to replace advice given to you by your health care provider. Make sure you discuss any questions you have with your health care provider. Document Released: 08/16/2007 Document Revised: 08/05/2015 Document Reviewed: 09/24/2012 Elsevier Interactive Patient Education  2017 Elsevier Inc.  

## 2017-03-15 ENCOUNTER — Ambulatory Visit: Payer: BLUE CROSS/BLUE SHIELD | Admitting: Obstetrics and Gynecology

## 2017-03-15 ENCOUNTER — Encounter: Payer: Self-pay | Admitting: Obstetrics and Gynecology

## 2017-03-15 VITALS — BP 110/68 | HR 66 | Ht 66.0 in | Wt 159.0 lb

## 2017-03-15 DIAGNOSIS — B373 Candidiasis of vulva and vagina: Secondary | ICD-10-CM

## 2017-03-15 DIAGNOSIS — B3731 Acute candidiasis of vulva and vagina: Secondary | ICD-10-CM | POA: Insufficient documentation

## 2017-03-15 LAB — POCT WET PREP WITH KOH
Clue Cells Wet Prep HPF POC: NEGATIVE
KOH PREP POC: NEGATIVE
Trichomonas, UA: NEGATIVE
YEAST WET PREP PER HPF POC: POSITIVE

## 2017-03-15 MED ORDER — CLOTRIMAZOLE-BETAMETHASONE 1-0.05 % EX CREA
1.0000 | TOPICAL_CREAM | Freq: Two times a day (BID) | CUTANEOUS | 0 refills | Status: DC
Start: 2017-03-15 — End: 2017-04-26

## 2017-03-15 MED ORDER — FLUCONAZOLE 150 MG PO TABS: ORAL_TABLET | ORAL | 0 refills | Status: DC

## 2017-03-15 NOTE — Patient Instructions (Signed)
I value your feedback and entrusting us with your care. If you get a [facility] patient survey, I would appreciate you taking the time to let us know about your experience today. Thank you! 

## 2017-03-15 NOTE — Progress Notes (Signed)
Chief Complaint  Patient presents with  . Pelvic Pain    vaginal irritation c some pain; more constant than when seen last visit    HPI:      Ms. Lori Blake is a 22 y.o. G0P0000 who LMP was Patient's last menstrual period was 09/09/2016., presents today for recurrent vaginal irritation but with worse pain now, no increased d/c, no odor. Treated for yeast 8/18 and 9/18 with diflucan multiple times. Pos yeast on nuswab 9/18. Pt treated with abx 6/18 which started sx. Pt had abx again 12/18 and sx recurred a couple wks ago. She's not sure they ever fully went away, however.  No meds used to treat. Uses dove sensitive skin soap/no dryer sheets. She has never been sex active, and can't tolerate tampons. She hasn't been able to have vag exam due to pain. Cotton swab exam for wet prep and culture done.  Past Medical History:  Diagnosis Date  . Frequent headaches   . History of chicken pox   . History of RSV infection    as infant  . Kidney stones 2013  . Pelvic mass in female 2010   fallopian tube mass removed left side  . Seasonal allergies   . Syncopal episodes 2016   vasovagal    Past Surgical History:  Procedure Laterality Date  . LAPAROSCOPIC REMOVAL ABDOMINAL MASS Left 2010   paratubular - fallopian tube  . Nexplanon     insertion and removal  . TONSILLECTOMY  2006  . TYMPANOSTOMY TUBE PLACEMENT  1999; 2006   second surgery was permanent ear tubes  . WRIST SURGERY Left 05/25/2016   flopping tear of LT ligament    Family History  Problem Relation Age of Onset  . Cancer Other        breast - multiple family members  . Melanoma Sister   . Cancer Paternal Grandmother        thyroid - and great grandfather  . CAD Maternal Grandfather   . Heart disease Paternal Grandfather   . Diabetes Paternal Grandfather   . Other Brother        hypoglycemia from hyperinsulinism  . Stroke Other        maternal great grandmother    Social History   Socioeconomic History  .  Marital status: Single    Spouse name: Not on file  . Number of children: Not on file  . Years of education: Not on file  . Highest education level: Not on file  Social Needs  . Financial resource strain: Not on file  . Food insecurity - worry: Not on file  . Food insecurity - inability: Not on file  . Transportation needs - medical: Not on file  . Transportation needs - non-medical: Not on file  Occupational History  . Not on file  Tobacco Use  . Smoking status: Never Smoker  . Smokeless tobacco: Never Used  Substance and Sexual Activity  . Alcohol use: No    Alcohol/week: 0.0 oz  . Drug use: No  . Sexual activity: No    Birth control/protection: Pill  Other Topics Concern  . Not on file  Social History Narrative   [employer] freshman.    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     Current Outpatient Medications:  .  cetirizine (ZYRTEC) 10 MG tablet, Take 10 mg by mouth as needed for allergies., Disp: ,  Rfl:  .  fluticasone (FLONASE) 50 MCG/ACT nasal spray, Place 2 sprays into both nostrils daily as needed for allergies or rhinitis., Disp: , Rfl:  .  norethindrone-ethinyl estradiol-iron (MICROGESTIN FE,GILDESS FE,LOESTRIN FE) 1.5-30 MG-MCG tablet, Take 1 tablet daily by mouth., Disp: 1 Package, Rfl: 12 .  amoxicillin-clavulanate (AUGMENTIN) 875-125 MG tablet, Take 1 tablet by mouth 2 (two) times daily. (Patient not taking: Reported on 03/15/2017), Disp: 20 tablet, Rfl: 0 .  clotrimazole-betamethasone (LOTRISONE) cream, Apply 1 application topically 2 (two) times daily. Apply externally BID prn sx up to 2 wks, Disp: 45 g, Rfl: 0 .  fluconazole (DIFLUCAN) 150 MG tablet, Take 1 tab once and then repeat 4 days later, Disp: 2 tablet, Rfl: 0 .  predniSONE (STERAPRED UNI-PAK 48 TAB) 10 MG (48) TBPK tablet, Take 6 tab/day PO for 2 days, 5 tab/day for 2 days, 4 tab/day for 2 days, 3 tab/day for 2 days, 2 tab/day for  4 days, 1 tab/day for 4 days (Patient not taking: Reported on 02/22/2017), Disp: 48 tablet, Rfl: 0   ROS:  Review of Systems  Constitutional: Negative for fever.  Gastrointestinal: Negative for blood in stool, constipation, diarrhea, nausea and vomiting.  Genitourinary: Positive for vaginal discharge and vaginal pain. Negative for dyspareunia, dysuria, flank pain, frequency, hematuria, urgency and vaginal bleeding.  Musculoskeletal: Negative for back pain.  Skin: Negative for rash.     OBJECTIVE:   Vitals:  BP 110/68   Pulse 66   Ht 5\' 6"  (1.676 m)   Wt 159 lb (72.1 kg)   LMP 09/09/2016 Comment: skips placebo pills d/t H/As  BMI 25.66 kg/m   Physical Exam  Constitutional: She is well-developed, well-nourished, and in no distress.  Genitourinary: Vulva exhibits erythema, exudate, rash and tenderness. Vulva exhibits no lesion.  Genitourinary Comments: SMALL VAG OPENING; QTIP EXAM UNCOMFORTABLE FOR PT; SKIN BREAKDOWN BILAT LABIA MAJORA WITH FISSURES     Results: Results for orders placed or performed in visit on 03/15/17 (from the past 24 hour(s))  POCT Wet Prep with KOH     Status: Abnormal   Collection Time: 03/15/17  2:25 PM  Result Value Ref Range   Trichomonas, UA Negative    Clue Cells Wet Prep HPF POC neg    Epithelial Wet Prep HPF POC  Few, Moderate, Many, Too numerous to count   Yeast Wet Prep HPF POC pos    Bacteria Wet Prep HPF POC  Few   RBC Wet Prep HPF POC     WBC Wet Prep HPF POC     KOH Prep POC Negative Negative     Assessment/Plan: Candidal vaginitis - Recurrent, but s/p abx use again 12/18. Pos wet prep/ext sx on exam. Rx diflucan x 2 and Rx lotrisone crm ext. May need terazol 3 tx if sx recur/persist.  - Plan: fluconazole (DIFLUCAN) 150 MG tablet, clotrimazole-betamethasone (LOTRISONE) cream, POCT Wet Prep with KOH    Meds ordered this encounter  Medications  . fluconazole (DIFLUCAN) 150 MG tablet    Sig: Take 1 tab once and then repeat 4 days  later    Dispense:  2 tablet    Refill:  0  . clotrimazole-betamethasone (LOTRISONE) cream    Sig: Apply 1 application topically 2 (two) times daily. Apply externally BID prn sx up to 2 wks    Dispense:  45 g    Refill:  0      Return if symptoms worsen or fail to improve.  Carsten Carstarphen B. Jalisia Puchalski, PA-C 03/15/2017  2:44 PM

## 2017-04-26 ENCOUNTER — Encounter: Payer: Self-pay | Admitting: Adult Health

## 2017-04-26 ENCOUNTER — Ambulatory Visit: Payer: BLUE CROSS/BLUE SHIELD | Admitting: Adult Health

## 2017-04-26 VITALS — BP 109/75 | HR 69 | Temp 98.1°F | Resp 16 | Ht 66.0 in | Wt 151.0 lb

## 2017-04-26 DIAGNOSIS — J029 Acute pharyngitis, unspecified: Secondary | ICD-10-CM

## 2017-04-26 MED ORDER — FLUCONAZOLE 150 MG PO TABS: 150.0000 mg | ORAL_TABLET | Freq: Once | ORAL | 0 refills | Status: DC

## 2017-04-26 MED ORDER — AMOXICILLIN 875 MG PO TABS: 875.0000 mg | ORAL_TABLET | Freq: Two times a day (BID) | ORAL | 0 refills | Status: DC

## 2017-04-26 MED ORDER — PREDNISONE 10 MG (21) PO TBPK: ORAL_TABLET | ORAL | 0 refills | Status: DC

## 2017-04-26 NOTE — Progress Notes (Addendum)
Subjective:     Patient ID: Lori Blake, female   DOB: [birth year], 22 y.o.   MRN: [id number]  HPI   Blood pressure 109/75, pulse 69, temperature 98.1 F (36.7 C), resp. rate 16, height 5\' 6"  (1.676 m), weight 151 lb (68.5 kg), last menstrual period 04/16/2017, SpO2 98 %.   Patient is 22 year old female in no acute distress who comes to the clinic with sore throat and constant irritation and ear pain/pressure. Sore throat. She reports she ran low grade fever yesterday  Nausea.  Wednesday on 04/25/17.   Patient's last menstrual period was 04/16/2017.   Denies any flu exposures. Roommate has been sick as well with sore thraot and fever.   She does have a history of strep throat.   Patient  denies any rash, chest pain, shortness of breath, nausea, vomiting, or diarrhea.  She has not followed up with ENT as she has been advised and educated to do.   Allergies  Allergen Reactions  . Pediacare Children [Acetaminophen] Hives    ? Orange dye; Ok with tylenol  . Shellfish Allergy     Allergy at  22 yo and seafood was positive allergy.  . Versed [Midazolam] Other (See Comments)    Paradoxical reaction   Patient Active Problem List   Diagnosis Date Noted  . Candidal vaginitis 03/15/2017  . History of placement of ear tubes 02/22/2017  . Left wrist injury 10/22/2015  . Left flank pain 10/22/2015  . Recurrent acute suppurative otitis media without spontaneous rupture of tympanic membrane of both sides 01/07/2015  . 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 Seasonal allergies   . Frequent headaches   . Pelvic mass in female      Review of Systems  Constitutional: Positive for fever.  HENT: Positive for sore throat. Negative for postnasal drip, rhinorrhea, sinus pressure, sinus pain and trouble swallowing (Painful swallowing/ able to swallow liquids and food ).   Respiratory: Negative.   Cardiovascular: Negative.   Gastrointestinal: Negative.    Musculoskeletal: Negative.   Neurological: Negative.   Hematological: Negative.   Psychiatric/Behavioral: Negative.  Behavioral problem:         Objective:   Physical Exam  Constitutional: She is oriented to person, place, and time. She appears well-developed and well-nourished. No distress.  HENT:  Head: Normocephalic and atraumatic.  Right Ear: Hearing and external ear normal. A foreign body is present.  Left Ear: Hearing and external ear normal. A foreign body (ear tubes bilaterally ) is present.  Nose: Nose normal.  Mouth/Throat: Uvula is midline and mucous membranes are normal. No uvula swelling. Oropharyngeal exudate and posterior oropharyngeal erythema present. No posterior oropharyngeal edema or tonsillar abscesses.  Eyes: Conjunctivae and EOM are normal. Pupils are equal, round, and reactive to light. Right eye exhibits no discharge. Left eye exhibits no discharge. No scleral icterus.  Neck: Normal range of motion. Neck supple. No JVD present. No tracheal deviation present.  Cardiovascular: Normal rate, regular rhythm, normal heart sounds and intact distal pulses. Exam reveals no gallop and no friction rub.  No murmur heard. Pulmonary/Chest: Effort normal and breath sounds normal. No stridor. No respiratory distress. She has no wheezes. She has no rales. She exhibits no tenderness.  Abdominal: Soft. Bowel sounds are normal.  Musculoskeletal: Normal range of motion.  Lymphadenopathy:       Head (right side): Tonsillar (mild enlargement ) adenopathy present.       Head (left side): No  tonsillar adenopathy present.    She has no cervical adenopathy.  Neurological: She is alert and oriented to person, place, and time. She displays normal reflexes. No cranial nerve deficit. She exhibits normal muscle tone. Coordination normal.  Patient moves on and off of exam table and in room without difficulty. Gait is normal in hall and in room. Patient is oriented to person place time and  situation. Patient answers questions appropriately and engages in conversation.  Skin: Skin is warm, dry and intact. No rash noted. She is not diaphoretic. No cyanosis or erythema. No pallor. Nails show no clubbing.  Psychiatric: She has a normal mood and affect. Her speech is normal and behavior is normal. Judgment and thought content normal. Cognition and memory are normal.  Nursing note and vitals reviewed.      Assessment:     Pharyngitis, unspecified etiology      Plan:     Meds ordered this encounter  Medications  . amoxicillin (AMOXIL) 875 MG tablet    Sig: Take 1 tablet (875 mg total) by mouth 2 (two) times daily.    Dispense:  20 tablet    Refill:  0  . predniSONE (STERAPRED UNI-PAK 21 TAB) 10 MG (21) TBPK tablet    Sig: By mouth Take 6 tablets on day 1, Take 5 tablets day 2 Take 4 tablets day 3 Take 3 tablets day 4 Take 2 tablets day five 5 Take 1 tablet day    Dispense:  21 tablet    Refill:  0  . fluconazole (DIFLUCAN) 150 MG tablet    Sig: Take 1 tablet (150 mg total) by mouth once for 1 dose.    Dispense:  1 tablet    Refill:  0   She requests Diflucan not Terazol cream- discussed risks versus benefits- she reports has vaginal yeast infection history with antibiotics. She denies any liver or kidney abnormalities and reports having recent labs done at Gynecology office. Diflucan only to be taken if yeast develops and follow up office visit with GYNECOLOGY if persists after treatment.   Also advise need to follow up as soon as possible with her provider ENT as she has not yet followed up as advised last visit discussed risks versus benefits.      Advised to return to clinic for an appointment if no improvement within 72 hours or if any symptoms change or worsen. Advised ER or urgent Care if after hours or on weekend. 911 for emergency symptoms at any time.   Patient verbalized understanding of all instructions given and denies any further questions at this time.

## 2017-04-26 NOTE — Patient Instructions (Signed)

## 2017-06-08 ENCOUNTER — Encounter: Payer: Self-pay | Admitting: Obstetrics and Gynecology

## 2017-06-10 ENCOUNTER — Other Ambulatory Visit: Payer: Self-pay | Admitting: Obstetrics and Gynecology

## 2017-06-10 DIAGNOSIS — B373 Candidiasis of vulva and vagina: Secondary | ICD-10-CM

## 2017-06-10 DIAGNOSIS — B3731 Acute candidiasis of vulva and vagina: Secondary | ICD-10-CM

## 2017-06-10 MED ORDER — FLUCONAZOLE 150 MG PO TABS: 150.0000 mg | ORAL_TABLET | Freq: Once | ORAL | 0 refills | Status: AC

## 2017-06-10 MED ORDER — CLOTRIMAZOLE-BETAMETHASONE 1-0.05 % EX CREA: 1.0000 "application " | TOPICAL_CREAM | Freq: Two times a day (BID) | CUTANEOUS | 0 refills | Status: DC

## 2017-06-10 NOTE — Progress Notes (Signed)
Rx RF diflucan and lotrisone crm for yeast after abx.

## 2017-06-18 ENCOUNTER — Other Ambulatory Visit: Payer: Self-pay | Admitting: Obstetrics and Gynecology

## 2017-06-18 ENCOUNTER — Encounter: Payer: Self-pay | Admitting: Obstetrics and Gynecology

## 2017-06-18 DIAGNOSIS — R51 Headache: Principal | ICD-10-CM

## 2017-06-18 DIAGNOSIS — R519 Headache, unspecified: Secondary | ICD-10-CM

## 2017-06-18 MED ORDER — NORETHIN ACE-ETH ESTRAD-FE 1.5-30 MG-MCG PO TABS: 1.0000 | ORAL_TABLET | Freq: Every day | ORAL | 0 refills | Status: DC

## 2017-06-18 NOTE — Progress Notes (Signed)
Rx RF till annual 
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
## 2017-09-18 ENCOUNTER — Encounter: Payer: Self-pay | Admitting: Obstetrics and Gynecology

## 2017-09-19 ENCOUNTER — Other Ambulatory Visit: Payer: Self-pay | Admitting: Obstetrics and Gynecology

## 2017-09-19 DIAGNOSIS — R519 Headache, unspecified: Secondary | ICD-10-CM

## 2017-09-19 DIAGNOSIS — R51 Headache: Principal | ICD-10-CM

## 2017-09-19 MED ORDER — NORETHIN ACE-ETH ESTRAD-FE 1.5-30 MG-MCG PO TABS: 1.0000 | ORAL_TABLET | Freq: Every day | ORAL | 0 refills | Status: DC

## 2017-09-19 NOTE — Progress Notes (Signed)
Rx RF till annual 

## 2017-10-09 ENCOUNTER — Ambulatory Visit (INDEPENDENT_AMBULATORY_CARE_PROVIDER_SITE_OTHER): Payer: BLUE CROSS/BLUE SHIELD | Admitting: Obstetrics and Gynecology

## 2017-10-09 ENCOUNTER — Other Ambulatory Visit (HOSPITAL_COMMUNITY)
Admission: RE | Admit: 2017-10-09 | Discharge: 2017-10-09 | Disposition: A | Discharge status: Home / Self Care | Payer: BLUE CROSS/BLUE SHIELD | Source: Ambulatory Visit | Attending: Obstetrics and Gynecology | Admitting: Obstetrics and Gynecology

## 2017-10-09 ENCOUNTER — Encounter: Payer: Self-pay | Admitting: Obstetrics and Gynecology

## 2017-10-09 VITALS — BP 120/76 | HR 77 | Ht 66.0 in | Wt 156.0 lb

## 2017-10-09 DIAGNOSIS — B373 Candidiasis of vulva and vagina: Secondary | ICD-10-CM

## 2017-10-09 DIAGNOSIS — N898 Other specified noninflammatory disorders of vagina: Secondary | ICD-10-CM | POA: Diagnosis not present

## 2017-10-09 DIAGNOSIS — Z3041 Encounter for surveillance of contraceptive pills: Secondary | ICD-10-CM

## 2017-10-09 DIAGNOSIS — Z01419 Encounter for gynecological examination (general) (routine) without abnormal findings: Secondary | ICD-10-CM

## 2017-10-09 DIAGNOSIS — Z01411 Encounter for gynecological examination (general) (routine) with abnormal findings: Secondary | ICD-10-CM | POA: Diagnosis not present

## 2017-10-09 DIAGNOSIS — B3731 Acute candidiasis of vulva and vagina: Secondary | ICD-10-CM

## 2017-10-09 DIAGNOSIS — Z124 Encounter for screening for malignant neoplasm of cervix: Secondary | ICD-10-CM | POA: Insufficient documentation

## 2017-10-09 LAB — POCT WET PREP WITH KOH
Clue Cells Wet Prep HPF POC: NEGATIVE
KOH Prep POC: NEGATIVE
TRICHOMONAS UA: NEGATIVE
YEAST WET PREP PER HPF POC: NEGATIVE

## 2017-10-09 MED ORDER — CLOTRIMAZOLE-BETAMETHASONE 1-0.05 % EX CREA: 1.0000 "application " | TOPICAL_CREAM | Freq: Two times a day (BID) | CUTANEOUS | 0 refills | Status: DC

## 2017-10-09 MED ORDER — NORETHIN ACE-ETH ESTRAD-FE 1.5-30 MG-MCG PO TABS: 1.0000 | ORAL_TABLET | Freq: Every day | ORAL | 4 refills | Status: DC

## 2017-10-09 MED ORDER — FLUCONAZOLE 150 MG PO TABS: 150.0000 mg | ORAL_TABLET | Freq: Once | ORAL | 2 refills | Status: AC

## 2017-10-09 NOTE — Patient Instructions (Signed)
I value your feedback and entrusting us with your care. If you get a [facility] patient survey, I would appreciate you taking the time to let us know about your experience today. Thank you! 

## 2017-10-09 NOTE — Progress Notes (Signed)
PCP:  Eustaquio BoydenGutierrez, Javier, MD   Chief Complaint  Patient presents with  . Gynecologic Exam    Poss yeast infection, slight itching and irritation, some white/yellow discharge x1 week      HPI:      Lori Blake is a 22 y.o. G0P0000 who LMP was Patient's last menstrual period was 07/16/2017 (exact date)., presents today for her annual examination.  Her menses are regular every 3 months with cont dosing of OCPs, lasting 5 days.  Dysmenorrhea mild, occurring first 1-2 days of flow. She does not have intermenstrual bleeding.  Sex activity: never sexually active.  Last Pap: N/A Hx of STDs: none  Pt complains of vaginal d/c with irritation for the past wk. No odor, recent abx use. Pt with hx of yeast vag 1/19 and again a few months later after abx use. Pt using dove sens skin soap/no dryer sheets. Treated with lotrisone crm and diflucan in past with sx relief. No tx this time because ran out of Rxs.   There is a FH of breast cancer in her MGM and MGGM, genetic testing not indicated. There is no FH of ovarian cancer. The patient does not do self-breast exams.  Tobacco use: The patient denies current or previous tobacco use. Alcohol use: social drinker No drug use.  Exercise: moderately active  She does get adequate calcium and Vitamin D in her diet. Never had [place] series.  Past Medical History:  Diagnosis Date  . Frequent headaches   . History of chicken pox   . History of RSV infection    as infant  . Kidney stones 2013  . Pelvic mass in female 2010   fallopian tube mass removed left side  . Seasonal allergies   . Syncopal episodes 2016   vasovagal    Past Surgical History:  Procedure Laterality Date  . LAPAROSCOPIC REMOVAL ABDOMINAL MASS Left 2010   paratubular - fallopian tube  . Nexplanon     insertion and removal  . TONSILLECTOMY  2006  . TYMPANOSTOMY TUBE PLACEMENT  1999; 2006   second surgery was permanent ear tubes  . WRIST SURGERY Left 05/25/2016   flopping tear of LT ligament    Family History  Problem Relation Age of Onset  . Breast cancer Other   . Melanoma Sister   . Cancer Paternal Grandmother        thyroid - and great grandfather  . CAD Maternal Grandfather   . Heart disease Paternal Grandfather   . Diabetes Paternal Grandfather   . Other Brother        hypoglycemia from hyperinsulinism  . Stroke Other        maternal great grandmother  . Skin cancer Maternal Aunt 30       Melanoma  . Breast cancer Maternal Grandmother [id number]    Social History   Socioeconomic History  . Marital status: Single    Spouse name: Not on file  . Number of children: Not on file  . Years of education: Not on file  . Highest education level: Not on file  Occupational History  . Not on file  Social Needs  . Financial resource strain: Not on file  . Food insecurity:    Worry: Not on file    Inability: Not on file  . Transportation needs:    Medical: Not on file    Non-medical: Not on file  Tobacco Use  . Smoking status: Never Smoker  . Smokeless tobacco: Never Used  Substance and Sexual Activity  . Alcohol use: Yes    Alcohol/week: 0.0 oz  . Drug use: No  . Sexual activity: Never    Birth control/protection: Pill  Lifestyle  . Physical activity:    Days per week: Not on file    Minutes per session: Not on file  . Stress: Not on file  Relationships  . Social connections:    Talks on phone: Not on file    Gets together: Not on file    Attends religious service: Not on file    Active member of club or organization: Not on file    Attends meetings of clubs or organizations: Not on file    Relationship status: Not on file  . Intimate partner violence:    Fear of current or ex partner: Not on file    Emotionally abused: Not on file    Physically abused: Not on file    Forced sexual activity: Not on file  Other Topics Concern  . Not on file  Social History Narrative   [employer] freshman.    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    Outpatient Medications Prior to Visit  Medication Sig Dispense Refill  . cetirizine (ZYRTEC) 10 MG tablet Take 10 mg by mouth as needed for allergies.    . fluticasone (FLONASE) 50 MCG/ACT nasal spray Place 2 sprays into both nostrils daily as needed for allergies or rhinitis.    . Probiotic Product (PROBIOTIC-10) CAPS Take by mouth.    . norethindrone-ethinyl estradiol-iron (MICROGESTIN FE,GILDESS FE,LOESTRIN FE) 1.5-30 MG-MCG tablet Take 1 tablet by mouth daily. CONTINUOUS DOSING 1 Package 0  . amoxicillin (AMOXIL) 875 MG tablet Take 1 tablet (875 mg total) by mouth 2 (two) times daily. 20 tablet 0  . clotrimazole-betamethasone (LOTRISONE) cream Apply 1 application topically 2 (two) times daily. Apply externally BID prn sx up to 2 wks (Patient not taking: Reported on 10/09/2017) 45 g 0  . dextromethorphan-guaiFENesin (MUCINEX DM) 30-600 MG 12hr tablet Take 1 tablet by mouth 2 (two) times daily.    . predniSONE (STERAPRED UNI-PAK 21 TAB) 10 MG (21) TBPK tablet By mouth Take 6 tablets on day 1, Take 5 tablets day 2 Take 4 tablets day 3 Take 3 tablets day 4 Take 2 tablets day five 5 Take 1 tablet day 21 tablet 0   No facility-administered medications prior to visit.     ROS:  Review of Systems  Constitutional: Negative for fatigue, fever and unexpected weight change.  Respiratory: Negative for cough, shortness of breath and wheezing.   Cardiovascular: Negative for chest pain, palpitations and leg swelling.  Gastrointestinal: Negative for blood in stool, constipation, diarrhea, nausea and vomiting.  Endocrine: Negative for cold intolerance, heat intolerance and polyuria.  Genitourinary: Positive for vaginal discharge. Negative for dyspareunia, dysuria, flank pain, frequency, genital sores, hematuria, menstrual problem, pelvic pain, urgency, vaginal bleeding and vaginal pain.  Musculoskeletal: Negative for  back pain, joint swelling and myalgias.  Skin: Negative for rash.  Neurological: Negative for dizziness, syncope, light-headedness, numbness and headaches.  Hematological: Negative for adenopathy.  Psychiatric/Behavioral: Negative for agitation, confusion, sleep disturbance and suicidal ideas. The patient is not nervous/anxious.   BREAST: No symptoms   Objective: BP 120/76   Pulse 77   Ht 5\' 6"  (1.676 m)   Wt 156 lb (70.8 kg)   LMP 07/16/2017 (Exact Date) Comment: Skips placebo week  BMI 25.18 kg/m    Physical Exam  Constitutional: She is oriented to person, place, and time. She appears well-developed and well-nourished.  Genitourinary: Vagina normal and uterus normal. There is no rash or tenderness on the right labia. There is no rash or tenderness on the left labia. No erythema or tenderness in the vagina. No vaginal discharge found. Right adnexum does not display mass and does not display tenderness. Left adnexum does not display mass and does not display tenderness. Cervix does not exhibit motion tenderness or polyp. Uterus is not enlarged or tender.  Genitourinary Comments: MILD ERYTHEMA EXT  Neck: Normal range of motion. No thyromegaly present.  Cardiovascular: Normal rate, regular rhythm and normal heart sounds.  No murmur heard. Pulmonary/Chest: Effort normal and breath sounds normal. Right breast exhibits no mass, no nipple discharge, no skin change and no tenderness. Left breast exhibits no mass, no nipple discharge, no skin change and no tenderness.  Abdominal: Soft. There is no tenderness. There is no guarding.  Musculoskeletal: Normal range of motion.  Neurological: She is alert and oriented to person, place, and time. No cranial nerve deficit.  Psychiatric: She has a normal mood and affect. Her behavior is normal.  Vitals reviewed. PT TOLERATED GYN EXAM; NOT NEEDED YEARLY IF NOT SEX ACTIVE  Results: Results for orders placed or performed in visit on 10/09/17 (from the  past 24 hour(s))  POCT Wet Prep with KOH     Status: Normal   Collection Time: 10/09/17 10:54 AM  Result Value Ref Range   Trichomonas, UA Negative    Clue Cells Wet Prep HPF POC neg    Epithelial Wet Prep HPF POC  Few, Moderate, Many, Too numerous to count   Yeast Wet Prep HPF POC neg    Bacteria Wet Prep HPF POC  Few   RBC Wet Prep HPF POC     WBC Wet Prep HPF POC     KOH Prep POC Negative Negative    Assessment/Plan: Encounter for annual routine gynecological examination  Cervical cancer screening - Plan: Cytology - PAP  Encounter for surveillance of contraceptive pills - OCP RF.  - Plan: norethindrone-ethinyl estradiol-iron (MICROGESTIN FE,GILDESS FE,LOESTRIN FE) 1.5-30 MG-MCG tablet  Candidal vaginitis - Neg wet prep/pos ext exam. Rx diflucan/lotrisone crm RF. F/u prn  - Plan: clotrimazole-betamethasone (LOTRISONE) cream  Vaginal itching - Plan: POCT Wet Prep with KOH, fluconazole (DIFLUCAN) 150 MG tablet  Meds ordered this encounter  Medications  . clotrimazole-betamethasone (LOTRISONE) cream    Sig: Apply 1 application topically 2 (two) times daily. Apply externally BID prn sx up to 2 wks    Dispense:  45 g    Refill:  0    Order Specific Question:   Supervising Provider    Answer:   Nadara Mustard [id number]  . norethindrone-ethinyl estradiol-iron (MICROGESTIN FE,GILDESS FE,LOESTRIN FE) 1.5-30 MG-MCG tablet    Sig: Take 1 tablet by mouth daily. CONTINUOUS DOSING    Dispense:  3 Package    Refill:  4    Order Specific Question:   Supervising Provider    Answer:   Nadara Mustard [id number]  . fluconazole (DIFLUCAN) 150 MG tablet    Sig: Take 1 tablet (150 mg total) by mouth once for 1 dose.    Dispense:  1 tablet    Refill:  2    Order Specific Question:   Supervising Provider    Answer:   Nadara Mustard [[id number]]  GYN counsel adequate intake of calcium and vitamin D, diet and exercise, Gardasil discussed and handout given     F/U  Return in about  1 year (around 10/10/2018).  Kylah Maresh B. Ailish Prospero, PA-C 10/09/2017 10:57 AM

## 2017-10-10 LAB — CYTOLOGY - PAP: DIAGNOSIS: NEGATIVE

## 2018-02-01 ENCOUNTER — Ambulatory Visit: Payer: BLUE CROSS/BLUE SHIELD | Admitting: Adult Health

## 2018-02-01 ENCOUNTER — Encounter: Payer: Self-pay | Admitting: Adult Health

## 2018-02-01 VITALS — BP 120/79 | HR 89 | Temp 98.7°F | Resp 16 | Ht 66.0 in | Wt 148.0 lb

## 2018-02-01 DIAGNOSIS — H66006 Acute suppurative otitis media without spontaneous rupture of ear drum, recurrent, bilateral: Secondary | ICD-10-CM

## 2018-02-01 DIAGNOSIS — Z9622 Myringotomy tube(s) status: Secondary | ICD-10-CM

## 2018-02-01 MED ORDER — FLUCONAZOLE 150 MG PO TABS: 150.0000 mg | ORAL_TABLET | Freq: Once | ORAL | 0 refills | Status: AC

## 2018-02-01 MED ORDER — AMOXICILLIN-POT CLAVULANATE 875-125 MG PO TABS: 1.0000 | ORAL_TABLET | Freq: Two times a day (BID) | ORAL | 0 refills | Status: DC

## 2018-02-01 NOTE — Progress Notes (Addendum)
Subjective:     Patient ID: Lori Blake, female   DOB: [birth year], 22 y.o.   MRN: [id number]  HPI   Blood pressure 120/79, pulse 89, temperature 98.7 F (37.1 C), resp. rate 16, height 5\' 6"  (1.676 m), weight 148 lb (67.1 kg), last menstrual period 02/01/2018, SpO2 98 %.  Patient is a 22 year old female in no acute distress who comes to the clinic with sore throat, nasal congestion and headache, ear pain.  Onset started yesterday morning 01/31/2018. Fever 101 oral last night. Mild chills.  Ear pain. She has had tubes in bilateral ears since she was 9 - has small whole in ear around tube right ear so ENT not removing tubes she reports.   Denies any facial swelling, no jaw pain.   Patient's last menstrual period was 02/01/2018.  She reports her mom was sick Monday when she visited her this week with symptoms.  Denies ear drainage.   Review of Systems  Constitutional: Positive for fatigue and fever. Negative for activity change, appetite change, chills, diaphoresis and unexpected weight change.  HENT: Positive for congestion, ear pain, postnasal drip and sore throat. Negative for dental problem, drooling, ear discharge, facial swelling, hearing loss, mouth sores, nosebleeds, rhinorrhea, sinus pressure, sinus pain, sneezing, tinnitus, trouble swallowing and voice change.   Respiratory: Negative.   Cardiovascular: Negative.   Gastrointestinal: Negative.   Musculoskeletal: Negative.   Skin: Negative.   Neurological: Negative.   Psychiatric/Behavioral: Negative.        Objective:   Physical Exam  Constitutional: Vital signs are normal. She appears well-developed and well-nourished. She is active.  HENT:  Head: Normocephalic and atraumatic.  Right Ear: Hearing and external ear normal. There is drainage. No swelling or tenderness. Tympanic membrane is perforated and erythematous. Tympanic membrane is not bulging. Right ear middle ear effusion: bilateral ear tubes appear in place.  right with small tear around tube- patient reports has been this way for two years. sees ENT.Drainage yellow from both tympanic tubes visualized  with otoscope./   Left Ear: Hearing and external ear normal. There is drainage. No swelling. Tympanic membrane is not perforated, not erythematous and not bulging.  Nose: Nose normal. Right sinus exhibits no maxillary sinus tenderness and no frontal sinus tenderness. Left sinus exhibits no maxillary sinus tenderness and no frontal sinus tenderness.  Mouth/Throat: Uvula is midline, oropharynx is clear and moist and mucous membranes are normal. Tonsils are 0 on the right. Tonsils are 0 on the left. No tonsillar exudate.  Eyes: Pupils are equal, round, and reactive to light. Conjunctivae, EOM and lids are normal.  Neck: Trachea normal, normal range of motion, full passive range of motion without pain and phonation normal. Neck supple. No Brudzinski's sign noted. No thyromegaly present.  Cardiovascular: Normal rate, regular rhythm, normal heart sounds and intact distal pulses. Exam reveals no gallop and no friction rub.  No murmur heard. Pulmonary/Chest: Effort normal and breath sounds normal. No stridor. She has no decreased breath sounds. She has no wheezes. She has no rhonchi. She has no rales.  Abdominal: Soft.  Musculoskeletal: Normal range of motion.  Lymphadenopathy:       Head (right side): No submental, no submandibular, no tonsillar, no preauricular, no posterior auricular and no occipital adenopathy present.       Head (left side): No submental, no submandibular, no tonsillar, no preauricular, no posterior auricular and no occipital adenopathy present.    She has no cervical adenopathy.  Neurological: She is alert.  Skin: Skin is warm, dry and intact. No rash noted. Nails show no clubbing.  Psychiatric: She has a normal mood and affect. Her speech is normal and behavior is normal. Judgment and thought content normal. Cognition and memory are normal.   Vitals reviewed.      Assessment:     Recurrent acute suppurative otitis media without spontaneous rupture of tympanic membrane of both sides      Plan:     Meds ordered this encounter  Medications  . amoxicillin-clavulanate (AUGMENTIN) 875-125 MG tablet    Sig: Take 1 tablet by mouth 2 (two) times daily.    Dispense:  20 tablet    Refill:  0  . fluconazole (DIFLUCAN) 150 MG tablet    Sig: Take 1 tablet (150 mg total) by mouth once for 1 dose. Only if needed.    Dispense:  1 tablet    Refill:  0   Return in about 1 week (around 02/08/2018), or see your ear nose thoat doctor in 1 week, for at any time for any worsening symptoms, Call 911 for emergencies.  Discussed viral etiology possible for fever and congestion. Treating otitis media/ yellow drainage with antibiotics.   Patient requested Diflucan with antibiotic will only take if yeast symptoms and if does not resolve will go to medical facility.   Advised patient call the office or your primary care doctor for an appointment if no improvement within 72 hours or if any symptoms change or worsen at any time  Advised ER or urgent Care if after hours or on weekend. Call 911 for emergency symptoms at any time.Patinet verbalized understanding of all instructions given/reviewed and treatment plan and has no further questions or concerns at this time.

## 2018-02-01 NOTE — Patient Instructions (Signed)
Otitis Media, Adult Otitis media is redness, soreness, and puffiness (swelling) in the space just behind your eardrum (middle ear). It may be caused by allergies or infection. It often happens along with a cold. Follow these instructions at home:  Take your medicine as told. Finish it even if you start to feel better.  Only take over-the-counter or prescription medicines for pain, discomfort, or fever as told by your doctor.  Follow up with your doctor as told. Contact a doctor if:  You have otitis media only in one ear, or bleeding from your nose, or both.  You notice a lump on your neck.  You are not getting better in 3-5 days.  You feel worse instead of better. Get help right away if:  You have pain that is not helped with medicine.  You have puffiness, redness, or pain around your ear.  You get a stiff neck.  You cannot move part of your face (paralysis).  You notice that the bone behind your ear hurts when you touch it. This information is not intended to replace advice given to you by your health care provider. Make sure you discuss any questions you have with your health care provider. Document Released: 08/16/2007 Document Revised: 08/05/2015 Document Reviewed: 09/24/2012 Elsevier Interactive Patient Education  2017 Elsevier Inc.  

## 2018-06-10 ENCOUNTER — Encounter: Payer: Self-pay | Admitting: Obstetrics and Gynecology

## 2018-06-11 ENCOUNTER — Other Ambulatory Visit: Payer: Self-pay | Admitting: Obstetrics and Gynecology

## 2018-06-11 MED ORDER — FLUCONAZOLE 150 MG PO TABS: 150.0000 mg | ORAL_TABLET | Freq: Once | ORAL | 0 refills | Status: DC

## 2018-06-11 NOTE — Progress Notes (Signed)
Rx Rf diflucan for yeast vag sx. F/u prn.

## 2018-08-13 ENCOUNTER — Encounter: Payer: Self-pay | Admitting: Obstetrics and Gynecology

## 2018-08-13 ENCOUNTER — Other Ambulatory Visit: Payer: Self-pay | Admitting: Obstetrics and Gynecology

## 2018-08-13 DIAGNOSIS — B373 Candidiasis of vulva and vagina: Secondary | ICD-10-CM

## 2018-08-13 DIAGNOSIS — B3731 Acute candidiasis of vulva and vagina: Secondary | ICD-10-CM

## 2018-08-13 MED ORDER — FLUCONAZOLE 150 MG PO TABS: 150.0000 mg | ORAL_TABLET | Freq: Once | ORAL | 0 refills | Status: AC

## 2018-08-13 MED ORDER — CLOTRIMAZOLE-BETAMETHASONE 1-0.05 % EX CREA: 1.0000 "application " | TOPICAL_CREAM | Freq: Two times a day (BID) | CUTANEOUS | 0 refills | Status: DC

## 2018-08-13 NOTE — Progress Notes (Signed)
Rx RF lotrisone crm and diflucan for yeast vag sx. RTO if sx persist.

## 2018-10-30 ENCOUNTER — Other Ambulatory Visit: Payer: Self-pay

## 2018-10-30 ENCOUNTER — Telehealth: Payer: BLUE CROSS/BLUE SHIELD | Admitting: Medical

## 2018-10-30 DIAGNOSIS — J0110 Acute frontal sinusitis, unspecified: Secondary | ICD-10-CM

## 2018-10-30 DIAGNOSIS — B3731 Acute candidiasis of vulva and vagina: Secondary | ICD-10-CM

## 2018-10-30 DIAGNOSIS — B373 Candidiasis of vulva and vagina: Secondary | ICD-10-CM

## 2018-10-30 MED ORDER — AMOXICILLIN-POT CLAVULANATE 875-125 MG PO TABS: 1.0000 | ORAL_TABLET | Freq: Two times a day (BID) | ORAL | 0 refills | Status: DC

## 2018-10-30 MED ORDER — FLUCONAZOLE 150 MG PO TABS: 150.0000 mg | ORAL_TABLET | Freq: Every day | ORAL | 0 refills | Status: DC

## 2018-10-30 NOTE — Progress Notes (Signed)
  Started last Thursday initially thought it was allergies/ weather change.  Runny nose  Occasionally yellow but not every time. and stuffy nose, HA on forehead, PND and cough from phelgm in thraot. Ear pain on the right side.  No fever or chills, denies cp or sob.     Hx of Sinusitis and seen ENT for  ear tubes in ears. Still has tubes  in her ears. No discharge out of ears.   takes Flonase every day , not taking Zyrtec consisitently.  Taking sudafed or  mucinex sinus giving relief and then symptoms return after medications has worn off. Taking Tylenol  For HA only in the morning, HA now it is currently 3/10.   No PE preformed due to telemedicine appt.   Diagnosis Sinusitis Frontal   Viral ,( wait 10 days from the start  of symptoms) or if fever or green discharge from nares then start antibiotics.  fever 100.5 or higher or green discharge from nares. She states she commonly get yeast vaginiitis with antiibioitcs and asks for  A  Diflucan pill.  Patient to call for appointment if not improving in 3-5 days while on antibiotics. She verbalizes understanding and has no questions at the end of our conversation.

## 2018-11-08 ENCOUNTER — Other Ambulatory Visit: Payer: Self-pay | Admitting: Obstetrics and Gynecology

## 2018-11-08 DIAGNOSIS — Z3041 Encounter for surveillance of contraceptive pills: Secondary | ICD-10-CM

## 2018-11-12 ENCOUNTER — Telehealth: Payer: Self-pay | Admitting: Family Medicine

## 2018-11-12 ENCOUNTER — Telehealth: Payer: Self-pay

## 2018-11-12 ENCOUNTER — Other Ambulatory Visit: Payer: Self-pay

## 2018-11-12 DIAGNOSIS — Z20822 Contact with and (suspected) exposure to covid-19: Secondary | ICD-10-CM

## 2018-11-12 DIAGNOSIS — R6889 Other general symptoms and signs: Secondary | ICD-10-CM | POA: Diagnosis not present

## 2018-11-12 DIAGNOSIS — Z20828 Contact with and (suspected) exposure to other viral communicable diseases: Secondary | ICD-10-CM

## 2018-11-12 NOTE — Telephone Encounter (Signed)
See earlier message  

## 2018-11-12 NOTE — Telephone Encounter (Signed)
Left message on vm per dpr relaying Dr. Synthia Innocent message.  Provided following info for [employer] testing site:  [place], M-F, 8:3:30, wear a mask and remain in your vehicle.

## 2018-11-12 NOTE — Telephone Encounter (Signed)
Patient said her Mom tested positive for covid 19.  Patient doesn't have any symptoms except a runny nose.  Patient had a sinus infection and finished antibiotics on Sunday. Patient wants to be tested for Covid.

## 2018-11-12 NOTE — Telephone Encounter (Signed)
Test ordered. plz call pt to review testing protocol.  If in close contact with mom, would recommend self quarantine for 2 wks from date of last contact, try to self isolate from mom at home.

## 2018-11-12 NOTE — Telephone Encounter (Signed)
Pt calling stating she lives with her mom who received positive COVID results 11/11/18.  Pt is requesting test but denies any sxs.

## 2018-11-12 NOTE — Telephone Encounter (Signed)
I already responded to this earlier today - yes please test.

## 2018-11-13 LAB — NOVEL CORONAVIRUS, NAA: SARS-CoV-2, NAA: DETECTED — AB

## 2018-11-14 ENCOUNTER — Telehealth: Payer: Self-pay | Admitting: Family Medicine

## 2018-11-14 NOTE — Telephone Encounter (Addendum)
Tried to call, no answer. plz call - mom's symptoms started 11/08/2018. Would start 10d count down from then as I'm hesitant to release from quarantine any sooner with recent positive test. Would be ok to end self-quarantine on 11/18/2018 as long as all symptoms have resolved. Do recommend continued use of mask when around others.

## 2018-11-14 NOTE — Telephone Encounter (Signed)
Spoke with pt relaying Dr. G's message. Pt verbalizes understanding.  

## 2018-11-14 NOTE — Telephone Encounter (Signed)
Best number [phone]  Pt called she tested  For covid Tuesday 9/1 results 9/2 it was positive and they told her to self quarantine for 10 day  From symptoms appears 24 with no fever.  Pt has not had any symptoms.  She had a sinus infection 8/19.  Pt needs clarification on how long she needs to quarantine

## 2018-12-20 ENCOUNTER — Other Ambulatory Visit: Payer: Self-pay

## 2018-12-20 ENCOUNTER — Telehealth: Payer: Self-pay | Admitting: Obstetrics and Gynecology

## 2018-12-20 DIAGNOSIS — Z3041 Encounter for surveillance of contraceptive pills: Secondary | ICD-10-CM

## 2018-12-20 MED ORDER — NORETHIN ACE-ETH ESTRAD-FE 1.5-30 MG-MCG PO TABS: 1.0000 | ORAL_TABLET | Freq: Every day | ORAL | 0 refills | Status: DC

## 2018-12-20 NOTE — Telephone Encounter (Signed)
RF sent.

## 2018-12-20 NOTE — Telephone Encounter (Signed)
Patient scheduled for annual 10/29 with [facility], will need refill on bc to get her to this visit. [facility].

## 2019-01-09 ENCOUNTER — Other Ambulatory Visit: Payer: Self-pay

## 2019-01-09 ENCOUNTER — Other Ambulatory Visit (HOSPITAL_COMMUNITY)
Admission: RE | Admit: 2019-01-09 | Discharge: 2019-01-09 | Disposition: A | Discharge status: Home / Self Care | Payer: BC Managed Care – PPO | Source: Ambulatory Visit | Attending: Obstetrics and Gynecology | Admitting: Obstetrics and Gynecology

## 2019-01-09 ENCOUNTER — Encounter: Payer: Self-pay | Admitting: Obstetrics and Gynecology

## 2019-01-09 ENCOUNTER — Ambulatory Visit (INDEPENDENT_AMBULATORY_CARE_PROVIDER_SITE_OTHER): Payer: BC Managed Care – PPO | Admitting: Obstetrics and Gynecology

## 2019-01-09 VITALS — BP 100/70 | Ht 66.0 in | Wt 155.0 lb

## 2019-01-09 DIAGNOSIS — Z23 Encounter for immunization: Secondary | ICD-10-CM

## 2019-01-09 DIAGNOSIS — B373 Candidiasis of vulva and vagina: Secondary | ICD-10-CM | POA: Diagnosis not present

## 2019-01-09 DIAGNOSIS — Z01419 Encounter for gynecological examination (general) (routine) without abnormal findings: Secondary | ICD-10-CM

## 2019-01-09 DIAGNOSIS — Z113 Encounter for screening for infections with a predominantly sexual mode of transmission: Secondary | ICD-10-CM

## 2019-01-09 DIAGNOSIS — Z803 Family history of malignant neoplasm of breast: Secondary | ICD-10-CM

## 2019-01-09 DIAGNOSIS — Z3041 Encounter for surveillance of contraceptive pills: Secondary | ICD-10-CM

## 2019-01-09 DIAGNOSIS — B3731 Acute candidiasis of vulva and vagina: Secondary | ICD-10-CM

## 2019-01-09 LAB — POCT WET PREP WITH KOH
Clue Cells Wet Prep HPF POC: NEGATIVE
KOH Prep POC: NEGATIVE
Trichomonas, UA: NEGATIVE
Yeast Wet Prep HPF POC: POSITIVE

## 2019-01-09 MED ORDER — TERCONAZOLE 0.4 % VA CREA: 1.0000 | TOPICAL_CREAM | Freq: Every day | VAGINAL | 0 refills | Status: DC

## 2019-01-09 MED ORDER — NORETHIN ACE-ETH ESTRAD-FE 1.5-30 MG-MCG PO TABS: 1.0000 | ORAL_TABLET | Freq: Every day | ORAL | 4 refills | Status: DC

## 2019-01-09 NOTE — Patient Instructions (Signed)
I value your feedback and entrusting us with your care. If you get a [facility] patient survey, I would appreciate you taking the time to let us know about your experience today. Thank you! 

## 2019-01-09 NOTE — Progress Notes (Signed)
PCP:  Eustaquio BoydenGutierrez, Javier, MD   Chief Complaint  Patient presents with  . Gynecologic Exam    flut shot  . Vaginal Itching    discomfor, no odor/discharge x few days     HPI:      Ms. Lori Blake is a 23 y.o. G0P0000 who LMP was Patient's last menstrual period was 11/25/2018 (approximate)., presents today for her annual examination.  Her menses are regular every 3-4 months with cont dosing of OCPs, lasting 4-5 days.  Dysmenorrhea mild, occurring first 1-2 days of flow. She does not usually have intermenstrual bleeding. On OCPs for cycle control.  Sex activity: sexually active once this past yr, used condoms.  Last Pap: 10/09/17 Results: no abnormalities Hx of STDs: none  Pt has had vaginal irritation, without increased d/c, odor for the past few days. Hx of yeast vag in past, treated with diflucan and lotrisone crm with sx relief. Last tx 6/20 with temp relief. Pt uses dove sens skin soap, no dryer sheets, wears cotton underwear, no pantyliners, no damp underwear, no bath bombs. Does use vaginal wipes sometimes.   There is a FH of breast cancer in her MGM (questionable) and MGGM, genetic testing not indicated but pt to clarify FH. There is no FH of ovarian cancer. The patient does not do self-breast exams.  Tobacco use: The patient denies current or previous tobacco use. Alcohol use: social drinker No drug use.  Exercise: moderately active  She does get adequate calcium but not Vitamin D in her diet. Never had Delene RuffiniGard series; discussed last yr.  Past Medical History:  Diagnosis Date  . Frequent headaches   . History of chicken pox   . History of RSV infection    as infant  . Kidney stones 2013  . Pelvic mass in female 2010   fallopian tube mass removed left side  . Seasonal allergies   . Syncopal episodes 2016   vasovagal    Past Surgical History:  Procedure Laterality Date  . LAPAROSCOPIC REMOVAL ABDOMINAL MASS Left 2010   paratubular - fallopian tube  . Nexplanon      insertion and removal  . TONSILLECTOMY  2006  . TYMPANOSTOMY TUBE PLACEMENT  1999; 2006   second surgery was permanent ear tubes  . WRIST SURGERY Left 05/25/2016   flopping tear of LT ligament    Family History  Problem Relation Age of Onset  . Breast cancer Other   . Cancer Paternal Grandmother        thyroid - and great grandfather  . CAD Maternal Grandfather   . Heart disease Paternal Grandfather   . Diabetes Paternal Grandfather   . Other Brother        hypoglycemia from hyperinsulinism  . Stroke Other        maternal great grandmother  . Skin cancer Maternal Aunt 30       Melanoma  . Breast cancer Maternal Grandmother [id number]    Social History   Socioeconomic History  . Marital status: Single    Spouse name: Not on file  . Number of children: Not on file  . Years of education: Not on file  . Highest education level: Not on file  Occupational History  . Not on file  Social Needs  . Financial resource strain: Not on file  . Food insecurity    Worry: Not on file    Inability: Not on file  . Transportation needs    Medical: Not on file  Non-medical: Not on file  Tobacco Use  . Smoking status: Never Smoker  . Smokeless tobacco: Never Used  Substance and Sexual Activity  . Alcohol use: Yes    Alcohol/week: 0.0 standard drinks  . Drug use: No  . Sexual activity: Not Currently    Birth control/protection: Pill  Lifestyle  . Physical activity    Days per week: Not on file    Minutes per session: Not on file  . Stress: Not on file  Relationships  . Social Musician on phone: Not on file    Gets together: Not on file    Attends religious service: Not on file    Active member of club or organization: Not on file    Attends meetings of clubs or organizations: Not on file    Relationship status: Not on file  . Intimate partner violence    Fear of current or ex partner: Not on file    Emotionally abused: Not on file    Physically abused: Not on  file    Forced sexual activity: Not on file  Other Topics Concern  . Not on file  Social History Narrative   [employer] freshman.    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    Outpatient Medications Prior to Visit  Medication Sig Dispense Refill  . cetirizine (ZYRTEC) 10 MG tablet Take 10 mg by mouth as needed for allergies.    . clotrimazole-betamethasone (LOTRISONE) cream Apply 1 application topically 2 (two) times daily. Apply externally BID prn sx up to 2 wks 45 g 0  . fluticasone (FLONASE) 50 MCG/ACT nasal spray Place 2 sprays into both nostrils daily as needed for allergies or rhinitis.    . Probiotic Product (PROBIOTIC-10) CAPS Take by mouth.    . norethindrone-ethinyl estradiol-iron (LOESTRIN FE) 1.5-30 MG-MCG tablet Take 1 tablet by mouth daily. CONTINUOUS DOSING 3 Package 0  . amoxicillin-clavulanate (AUGMENTIN) 875-125 MG tablet Take 1 tablet by mouth 2 (two) times daily. 20 tablet 0  . fluconazole (DIFLUCAN) 150 MG tablet Take 1 tablet (150 mg total) by mouth daily. 1 tablet 0   No facility-administered medications prior to visit.     ROS:  Review of Systems  Constitutional: Negative for fatigue, fever and unexpected weight change.  Respiratory: Negative for cough, shortness of breath and wheezing.   Cardiovascular: Negative for chest pain, palpitations and leg swelling.  Gastrointestinal: Negative for blood in stool, constipation, diarrhea, nausea and vomiting.  Endocrine: Negative for cold intolerance, heat intolerance and polyuria.  Genitourinary: Negative for dyspareunia, dysuria, flank pain, frequency, genital sores, hematuria, menstrual problem, pelvic pain, urgency, vaginal bleeding, vaginal discharge and vaginal pain.  Musculoskeletal: Negative for back pain, joint swelling and myalgias.  Skin: Negative for rash.  Neurological: Negative for dizziness, syncope, light-headedness, numbness and  headaches.  Hematological: Negative for adenopathy.  Psychiatric/Behavioral: Negative for agitation, confusion, sleep disturbance and suicidal ideas. The patient is not nervous/anxious.   BREAST: No symptoms   Objective: BP 100/70   Ht 5\' 6"  (1.676 m)   Wt 155 lb (70.3 kg)   LMP 11/25/2018 (Approximate)   BMI 25.02 kg/m    Physical Exam Constitutional:      Appearance: She is well-developed.  Genitourinary:     Vulva, vagina, uterus, right adnexa and left adnexa normal.     No vulval lesion or tenderness noted.     No  vaginal discharge, erythema or tenderness.     No cervical motion tenderness or polyp.     Uterus is not enlarged or tender.     No right or left adnexal mass present.     Right adnexa not tender.     Left adnexa not tender.     Genitourinary Comments: MILD ERYTHEMA EXT  Neck:     Musculoskeletal: Normal range of motion.     Thyroid: No thyromegaly.  Cardiovascular:     Rate and Rhythm: Normal rate and regular rhythm.     Heart sounds: Normal heart sounds. No murmur.  Pulmonary:     Effort: Pulmonary effort is normal.     Breath sounds: Normal breath sounds.  Chest:     Breasts:        Right: No mass, nipple discharge, skin change or tenderness.        Left: No mass, nipple discharge, skin change or tenderness.  Abdominal:     Palpations: Abdomen is soft.     Tenderness: There is no abdominal tenderness. There is no guarding.  Musculoskeletal: Normal range of motion.  Neurological:     General: No focal deficit present.     Mental Status: She is alert and oriented to person, place, and time.     Cranial Nerves: No cranial nerve deficit.  Skin:    General: Skin is warm and dry.  Psychiatric:        Mood and Affect: Mood normal.        Behavior: Behavior normal.        Thought Content: Thought content normal.        Judgment: Judgment normal.  Vitals signs reviewed.     Results: Results for orders placed or performed in visit on 01/09/19 (from  the past 24 hour(s))  POCT Wet Prep with KOH     Status: Abnormal   Collection Time: 01/09/19  4:20 PM  Result Value Ref Range   Trichomonas, UA Negative    Clue Cells Wet Prep HPF POC neg    Epithelial Wet Prep HPF POC     Yeast Wet Prep HPF POC pos    Bacteria Wet Prep HPF POC     RBC Wet Prep HPF POC     WBC Wet Prep HPF POC     KOH Prep POC Negative Negative    Assessment/Plan: Encounter for annual routine gynecological examination  Screening for STD (sexually transmitted disease) - Plan: Cervicovaginal ancillary only  Encounter for surveillance of contraceptive pills - Plan: norethindrone-ethinyl estradiol-iron (LOESTRIN FE) 1.5-30 MG-MCG tablet; OCP RF  Candidal vaginitis - Plan: POCT Wet Prep with KOH, terconazole (TERAZOL 7) 0.4 % vaginal cream; Pos sx/wet prep. Rx terazol. F/u prn.   Family history of breast cancer--pt to clarify FH and f/u prn.  Needs flu shot - Plan: Flu Vaccine QUAD 36+ mos IM (Fluarix, Quad PF)  Meds ordered this encounter  Medications  . norethindrone-ethinyl estradiol-iron (LOESTRIN FE) 1.5-30 MG-MCG tablet    Sig: Take 1 tablet by mouth daily. CONTINUOUS DOSING    Dispense:  3 Package    Refill:  4    Order Specific Question:   Supervising Provider    Answer:   Nadara Mustard [id number]  . terconazole (TERAZOL 7) 0.4 % vaginal cream    Sig: Place 1 applicator vaginally at bedtime.    Dispense:  45 g    Refill:  0    Order Specific Question:   Supervising Provider  Answer:   Gae Dry [[id number]]             GYN counsel adequate intake of calcium and vitamin D, diet and exercise     F/U  Return in about 1 year (around 01/09/2020).   B. , PA-C 01/09/2019 4:23 PM

## 2019-01-13 LAB — CERVICOVAGINAL ANCILLARY ONLY
Chlamydia: NEGATIVE
Comment: NEGATIVE
Comment: NORMAL
Neisseria Gonorrhea: NEGATIVE

## 2019-01-14 ENCOUNTER — Encounter: Payer: Self-pay | Admitting: Obstetrics and Gynecology

## 2019-03-10 ENCOUNTER — Ambulatory Visit: Payer: BC Managed Care – PPO | Admitting: Family Medicine

## 2019-03-10 ENCOUNTER — Other Ambulatory Visit: Payer: Self-pay

## 2019-03-10 ENCOUNTER — Encounter: Payer: Self-pay | Admitting: Family Medicine

## 2019-03-10 VITALS — BP 112/64 | HR 85 | Temp 98.3°F | Ht 66.0 in | Wt 155.4 lb

## 2019-03-10 DIAGNOSIS — Z9622 Myringotomy tube(s) status: Secondary | ICD-10-CM

## 2019-03-10 DIAGNOSIS — H6692 Otitis media, unspecified, left ear: Secondary | ICD-10-CM | POA: Diagnosis not present

## 2019-03-10 MED ORDER — FLUCONAZOLE 150 MG PO TABS: 150.0000 mg | ORAL_TABLET | Freq: Once | ORAL | 0 refills | Status: AC

## 2019-03-10 MED ORDER — AMOXICILLIN-POT CLAVULANATE 875-125 MG PO TABS: 1.0000 | ORAL_TABLET | Freq: Two times a day (BID) | ORAL | 0 refills | Status: DC

## 2019-03-10 NOTE — Progress Notes (Signed)
This visit was conducted in person.  BP 112/64 (BP Location: Left Arm, Patient Position: Sitting, Cuff Size: Normal)   Pulse 85   Temp 98.3 F (36.8 C) (Temporal)   Ht 5\' 6"  (1.676 m)   Wt 155 lb 7 oz (70.5 kg)   LMP 02/17/2019   SpO2 98%   BMI 25.09 kg/m    CC: L ear pain Subjective:    Patient ID: 14/09/2018, female    DOB: September 28, 1995, 23 y.o.   MRN: [id number]  HPI: Lori Blake is a 23 y.o. female presenting on 03/10/2019 for Ear Pain (C/o left ear pain.  Started yesterday.  Tried ibuprofen and Sudafed, helpful. )   Graduated [employer] in May - working June.   1d h/o L ear pain and ear feels full. No ear drainage, recent swimming, fevers/chills, head congestion, ST, cough. No tinnitus.   H/o recurrent ear infections, none recently.   Treating with ibuprofen, pseudophed with benefit.      Relevant past medical, surgical, family and social history reviewed and updated as indicated. Interim medical history since our last visit reviewed. Allergies and medications reviewed and updated. Outpatient Medications Prior to Visit  Medication Sig Dispense Refill  . Ascorbic Acid (VITAMIN C) 1000 MG tablet Take 1,000 mg by mouth daily.    . Cholecalciferol (VITAMIN D3 PO) Take 1 capsule by mouth daily.    . cetirizine (ZYRTEC) 10 MG tablet Take 10 mg by mouth as needed for allergies.    . fluticasone (FLONASE) 50 MCG/ACT nasal spray Place 2 sprays into both nostrils daily as needed for allergies or rhinitis.    Optometrist norethindrone-ethinyl estradiol-iron (LOESTRIN FE) 1.5-30 MG-MCG tablet Take 1 tablet by mouth daily. CONTINUOUS DOSING 3 Package 4  . Probiotic Product (PROBIOTIC-10) CAPS Take by mouth.    . clotrimazole-betamethasone (LOTRISONE) cream Apply 1 application topically 2 (two) times daily. Apply externally BID prn sx up to 2 wks 45 g 0  . terconazole (TERAZOL 7) 0.4 % vaginal cream Place 1 applicator vaginally at bedtime. 45 g 0   No  facility-administered medications prior to visit.     Per HPI unless specifically indicated in ROS section below Review of Systems Objective:    BP 112/64 (BP Location: Left Arm, Patient Position: Sitting, Cuff Size: Normal)   Pulse 85   Temp 98.3 F (36.8 C) (Temporal)   Ht 5\' 6"  (1.676 m)   Wt 155 lb 7 oz (70.5 kg)   LMP 02/17/2019   SpO2 98%   BMI 25.09 kg/m   Wt Readings from Last 3 Encounters:  03/10/19 155 lb 7 oz (70.5 kg)  01/09/19 155 lb (70.3 kg)  02/01/18 148 lb (67.1 kg)    Physical Exam Vitals and nursing note reviewed.  Constitutional:      Appearance: Normal appearance. She is not ill-appearing.  HENT:     Head: Normocephalic and atraumatic.     Right Ear: Hearing, tympanic membrane, ear canal and external ear normal. No tenderness. A PE tube is present. Tympanic membrane is not erythematous.     Left Ear: Hearing, ear canal and external ear normal. Tenderness present. A PE tube is present. Tympanic membrane is erythematous.     Ears:     Comments: PE tubes in place bilaterally. L superior TM with erythema, mild drainage present Eyes:     Extraocular Movements: Extraocular movements intact.     Conjunctiva/sclera: Conjunctivae normal.     Pupils: Pupils are equal,  round, and reactive to light.  Neck:     Thyroid: No thyromegaly or thyroid tenderness.  Musculoskeletal:     Cervical back: Normal range of motion and neck supple.  Lymphadenopathy:     Cervical: No cervical adenopathy.  Neurological:     Mental Status: She is alert.       Assessment & Plan:  This visit occurred during the SARS-CoV-2 public health emergency.  Safety protocols were in place, including screening questions prior to the visit, additional usage of staff PPE, and extensive cleaning of exam room while observing appropriate contact time as indicated for disinfecting solutions.   Problem List Items Addressed This Visit    Status post myringotomy with tube placement of both ears    Acute left otitis media - Primary    Treat with oral abx course. Update if not improving with treatment. She does have ear tubes in place bilaterally, no significant drainage at this time. Rx diflucan ppx per pt request.       Relevant Medications   fluconazole (DIFLUCAN) 150 MG tablet   amoxicillin-clavulanate (AUGMENTIN) 875-125 MG tablet       Meds ordered this encounter  Medications  . fluconazole (DIFLUCAN) 150 MG tablet    Sig: Take 1 tablet (150 mg total) by mouth once for 1 dose. Repeat in 4 days if needed    Dispense:  2 tablet    Refill:  0  . amoxicillin-clavulanate (AUGMENTIN) 875-125 MG tablet    Sig: Take 1 tablet by mouth 2 (two) times daily for 10 days.    Dispense:  14 tablet    Refill:  0   No orders of the defined types were placed in this encounter.   Patient Instructions  You do have left ear infection.  Treat with amoxicillin antibiotic sent to pharmacy.  Diflucan sent to pharmacy. Take after finishing antibiotics if needed, and 2nd pill 4 days afterwards if needed.  May continue tylenol or ibuprofen as needed for discomfort.  Push fluids and rest.    Follow up plan: Return if symptoms worsen or fail to improve.  Ria Bush, MD

## 2019-03-10 NOTE — Patient Instructions (Signed)
You do have left ear infection.  Treat with amoxicillin antibiotic sent to pharmacy.  Diflucan sent to pharmacy. Take after finishing antibiotics if needed, and 2nd pill 4 days afterwards if needed.  May continue tylenol or ibuprofen as needed for discomfort.  Push fluids and rest.

## 2019-03-10 NOTE — Assessment & Plan Note (Signed)
Treat with oral abx course. Update if not improving with treatment. She does have ear tubes in place bilaterally, no significant drainage at this time. Rx diflucan ppx per pt request.

## 2019-03-18 ENCOUNTER — Ambulatory Visit: Payer: BC Managed Care – PPO | Admitting: Family Medicine

## 2019-03-18 ENCOUNTER — Other Ambulatory Visit: Payer: Self-pay

## 2019-03-18 ENCOUNTER — Encounter: Payer: Self-pay | Admitting: Family Medicine

## 2019-03-18 DIAGNOSIS — H66005 Acute suppurative otitis media without spontaneous rupture of ear drum, recurrent, left ear: Secondary | ICD-10-CM | POA: Diagnosis not present

## 2019-03-18 MED ORDER — CEFDINIR 300 MG PO CAPS: 300.0000 mg | ORAL_CAPSULE | Freq: Two times a day (BID) | ORAL | 0 refills | Status: DC

## 2019-03-18 NOTE — Progress Notes (Signed)
This visit was conducted in person.  BP 112/62 (BP Location: Left Arm, Patient Position: Sitting, Cuff Size: Normal)   Pulse 94   Temp 97.7 F (36.5 C) (Temporal)   Ht 5\' 6"  (1.676 m)   Wt 158 lb 4.8 oz (71.8 kg)   LMP 02/17/2019   SpO2 97%   BMI 25.55 kg/m    CC: ear pain Subjective:    Patient ID: 14/09/2018, female    DOB: 12/01/1995, 24 y.o.   MRN: [id number]  HPI: Lori Blake is a 24 y.o. female presenting on 03/18/2019 for Otalgia (left ear pain still present, new right ear pain x 3-4 days ago. worsening pain. )   See prior note for details.  Seen here last week with new onset L ear pain in setting of known bilateral ear tubes. Dx acute L otitis media. Treated with augmentin 7d course. Ear pain improved however notes persistent muffled hearing and feeling of fullness to left ear. Now with R ear discomfort, congestion that has started over last few days.   No ear drainage, fevers/chills, hearing changes.   Last abx use was ~09/2018 for sinus infection.      Relevant past medical, surgical, family and social history reviewed and updated as indicated. Interim medical history since our last visit reviewed. Allergies and medications reviewed and updated. Outpatient Medications Prior to Visit  Medication Sig Dispense Refill  . Ascorbic Acid (VITAMIN C) 1000 MG tablet Take 1,000 mg by mouth daily.    . cetirizine (ZYRTEC) 10 MG tablet Take 10 mg by mouth as needed for allergies.    . Cholecalciferol (VITAMIN D3 PO) Take 1 capsule by mouth daily.    . fluconazole (DIFLUCAN) 150 MG tablet TAKE 1 TABLET (150 MG TOTAL) BY MOUTH ONCE FOR 1 DOSE. REPEAT IN 4 DAYS IF NEEDED    . fluticasone (FLONASE) 50 MCG/ACT nasal spray Place 2 sprays into both nostrils daily as needed for allergies or rhinitis.    03-22-1993 norethindrone-ethinyl estradiol-iron (LOESTRIN FE) 1.5-30 MG-MCG tablet Take 1 tablet by mouth daily. CONTINUOUS DOSING 3 Package 4  . Probiotic Product (PROBIOTIC-10) CAPS  Take by mouth.    01-14-1978 amoxicillin-clavulanate (AUGMENTIN) 875-125 MG tablet Take 1 tablet by mouth 2 (two) times daily for 10 days. (Patient not taking: Reported on 03/18/2019) 14 tablet 0   No facility-administered medications prior to visit.     Per HPI unless specifically indicated in ROS section below Review of Systems Objective:    BP 112/62 (BP Location: Left Arm, Patient Position: Sitting, Cuff Size: Normal)   Pulse 94   Temp 97.7 F (36.5 C) (Temporal)   Ht 5\' 6"  (1.676 m)   Wt 158 lb 4.8 oz (71.8 kg)   LMP 02/17/2019   SpO2 97%   BMI 25.55 kg/m   Wt Readings from Last 3 Encounters:  03/18/19 158 lb 4.8 oz (71.8 kg)  03/10/19 155 lb 7 oz (70.5 kg)  01/09/19 155 lb (70.3 kg)    Physical Exam Vitals and nursing note reviewed.  Constitutional:      General: She is not in acute distress.    Appearance: Normal appearance. She is not ill-appearing.  HENT:     Head: Normocephalic and atraumatic.     Right Ear: Hearing, tympanic membrane, ear canal and external ear normal. No tenderness. A PE tube is present. Tympanic membrane is not erythematous.     Left Ear: Hearing, ear canal and external ear normal. No tenderness. A PE  tube is present. Tympanic membrane is erythematous.     Ears:     Comments: PE tubes in place bilaterally. L anterior superior TM with erythema, mild drainage present - largely similar to last week    Nose: Nose normal. No congestion or rhinorrhea.  Eyes:     Extraocular Movements: Extraocular movements intact.     Conjunctiva/sclera: Conjunctivae normal.     Pupils: Pupils are equal, round, and reactive to light.  Neck:     Thyroid: No thyromegaly or thyroid tenderness.  Musculoskeletal:     Cervical back: Normal range of motion and neck supple.  Lymphadenopathy:     Head:     Right side of head: No submental, submandibular, tonsillar, preauricular, posterior auricular or occipital adenopathy.     Left side of head: No submental, submandibular,  tonsillar, preauricular, posterior auricular or occipital adenopathy.     Cervical: No cervical adenopathy.  Neurological:     Mental Status: She is alert.       Assessment & Plan:  This visit occurred during the SARS-CoV-2 public health emergency.  Safety protocols were in place, including screening questions prior to the visit, additional usage of staff PPE, and extensive cleaning of exam room while observing appropriate contact time as indicated for disinfecting solutions.   Problem List Items Addressed This Visit    Recurrent otitis media of left ear    S/p 7 day augmentin course with mild improvement in pain but persistent discomfort, ear fullness, and overall same exam as last week. Anticipate treatment failure - will rx cefdinir 300mg  bid x 10 days. Advised let us know if not improving with treatment for return to ENT.  Both ear tubes remain in place.       Relevant Medications   fluconazole (DIFLUCAN) 150 MG tablet   cefdinir (OMNICEF) 300 MG capsule       Meds ordered this encounter  Medications  . cefdinir (OMNICEF) 300 MG capsule    Sig: Take 1 capsule (300 mg total) by mouth 2 (two) times daily.    Dispense:  20 capsule    Refill:  0   No orders of the defined types were placed in this encounter.   Patient Instructions  Possible partial treatment of ear infection - change antibiotic to new medicine sent to pharmacy.  Call me or message me if no better for return to ENT (Dr Tami Ribas).    Follow up plan: No follow-ups on file.  Ria Bush, MD

## 2019-03-18 NOTE — Assessment & Plan Note (Signed)
S/p 7 day augmentin course with mild improvement in pain but persistent discomfort, ear fullness, and overall same exam as last week. Anticipate treatment failure - will rx cefdinir 300mg  bid x 10 days. Advised let know if not improving with treatment for return to ENT.  Both ear tubes remain in place.

## 2019-03-18 NOTE — Patient Instructions (Signed)
Possible partial treatment of ear infection - change antibiotic to new medicine sent to pharmacy.  Call me or message me if no better for return to ENT (Dr Jenne Campus).

## 2019-04-14 ENCOUNTER — Ambulatory Visit: Payer: BC Managed Care – PPO | Attending: Internal Medicine

## 2019-04-14 DIAGNOSIS — Z20822 Contact with and (suspected) exposure to covid-19: Secondary | ICD-10-CM | POA: Diagnosis not present

## 2019-04-15 LAB — NOVEL CORONAVIRUS, NAA: SARS-CoV-2, NAA: NOT DETECTED

## 2019-06-09 ENCOUNTER — Other Ambulatory Visit: Payer: Self-pay

## 2019-06-09 ENCOUNTER — Emergency Department
Admission: EM | Admit: 2019-06-09 | Discharge: 2019-06-10 | Disposition: A | Discharge status: Home / Self Care | Payer: BC Managed Care – PPO | Attending: Emergency Medicine | Admitting: Emergency Medicine

## 2019-06-09 DIAGNOSIS — Z79899 Other long term (current) drug therapy: Secondary | ICD-10-CM | POA: Insufficient documentation

## 2019-06-09 DIAGNOSIS — R112 Nausea with vomiting, unspecified: Secondary | ICD-10-CM | POA: Diagnosis not present

## 2019-06-09 DIAGNOSIS — R1031 Right lower quadrant pain: Secondary | ICD-10-CM | POA: Diagnosis not present

## 2019-06-09 DIAGNOSIS — R197 Diarrhea, unspecified: Secondary | ICD-10-CM | POA: Diagnosis not present

## 2019-06-09 DIAGNOSIS — R6883 Chills (without fever): Secondary | ICD-10-CM | POA: Diagnosis not present

## 2019-06-09 DIAGNOSIS — K920 Hematemesis: Secondary | ICD-10-CM | POA: Diagnosis not present

## 2019-06-09 LAB — CBC
HCT: 53.3 % — ABNORMAL HIGH (ref 36.0–46.0)
Hemoglobin: 18.3 g/dL — ABNORMAL HIGH (ref 12.0–15.0)
MCH: 32.6 pg (ref 26.0–34.0)
MCHC: 34.3 g/dL (ref 30.0–36.0)
MCV: 95 fL (ref 80.0–100.0)
Platelets: 192 10*3/uL (ref 150–400)
RBC: 5.61 MIL/uL — ABNORMAL HIGH (ref 3.87–5.11)
RDW: 12.1 % (ref 11.5–15.5)
WBC: 12.5 10*3/uL — ABNORMAL HIGH (ref 4.0–10.5)
nRBC: 0 % (ref 0.0–0.2)

## 2019-06-09 LAB — COMPREHENSIVE METABOLIC PANEL
ALT: 22 U/L (ref 0–44)
AST: 26 U/L (ref 15–41)
Albumin: 3.9 g/dL (ref 3.5–5.0)
Alkaline Phosphatase: 52 U/L (ref 38–126)
Anion gap: 13 (ref 5–15)
BUN: 21 mg/dL — ABNORMAL HIGH (ref 6–20)
CO2: 22 mmol/L (ref 22–32)
Calcium: 8.9 mg/dL (ref 8.9–10.3)
Chloride: 104 mmol/L (ref 98–111)
Creatinine, Ser: 1.24 mg/dL — ABNORMAL HIGH (ref 0.44–1.00)
GFR calc Af Amer: >60 mL/min (ref >60)
GFR calc non Af Amer: >60 mL/min (ref >60)
Glucose, Bld: 154 mg/dL — ABNORMAL HIGH (ref 70–99)
Potassium: 4.6 mmol/L (ref 3.5–5.1)
Sodium: 139 mmol/L (ref 135–145)
Total Bilirubin: 1.8 mg/dL — ABNORMAL HIGH (ref 0.3–1.2)
Total Protein: 7.3 g/dL (ref 6.5–8.1)

## 2019-06-09 LAB — LIPASE, BLOOD: Lipase: 19 U/L (ref 11–51)

## 2019-06-09 MED ORDER — DICYCLOMINE HCL 10 MG PO CAPS
20.0000 mg | ORAL_CAPSULE | Freq: Once | ORAL | Status: AC
  Administered 2019-06-09: 20 mg via ORAL
  Filled 2019-06-09: qty 2

## 2019-06-09 MED ORDER — LACTATED RINGERS IV BOLUS
2000.0000 mL | Freq: Once | INTRAVENOUS | Status: AC
  Administered 2019-06-09: 2000 mL via INTRAVENOUS

## 2019-06-09 MED ORDER — ONDANSETRON 4 MG PO TBDP
4.0000 mg | ORAL_TABLET | Freq: Once | ORAL | Status: AC | PRN
  Administered 2019-06-09: 4 mg via ORAL
  Filled 2019-06-09: qty 1

## 2019-06-09 MED ORDER — ONDANSETRON HCL 4 MG/2ML IJ SOLN
4.0000 mg | Freq: Once | INTRAMUSCULAR | Status: AC
  Administered 2019-06-09: 4 mg via INTRAVENOUS
  Filled 2019-06-09: qty 2

## 2019-06-09 NOTE — ED Triage Notes (Signed)
Pt arrives to ED via POV from home with c/o hematemesis and diarrhea x1 day. Pt reports "10" episodes of diarrhea and "14" episodes of vomiting since noon today. Pt reports the emesis was "dark brown". Pt denies any recent sick contacts; pt denies SHOB or CP. Pt is A&O, in NAD; RR even, regular, and unlabored.

## 2019-06-09 NOTE — ED Provider Notes (Signed)
[facility] Emergency Department Provider Note  ____________________________________________   First MD Initiated Contact with Patient 06/09/19 2323     (approximate)  I have reviewed the triage vital signs and the nursing notes.   HISTORY  Chief Complaint Hematemesis and Diarrhea    HPI Lori Blake is a 24 y.o. female  With h/o kidney stones here with lower diffuse abd pain. Pt reports sx started gradually this morning with mild diffuse abd cramping, followed by diarrhea then emesis. She has had >10 episodes of emesis and diarrhea since then. She has been unable to eat or drink. She has had ongoing nausea. No improvement throughout day. She has had chills, no known fevers. No recent ABX [place]. No recent travel. She has begun to have streaks of blood in both her emesis and her diarrhea. No suspicious food intake. No sick contacts.  Pain si aching, cramping, worse w/ movement and flexion of her abdomen.       Past Medical History:  Diagnosis Date  . Frequent headaches   . History of chicken pox   . History of RSV infection    as infant  . Kidney stones 2013  . Paratubal cyst 2010   Dr. Harold Hedge; LT side; benign pathology  . Seasonal allergies   . Syncopal episodes 2016   vasovagal    Patient Active Problem List   Diagnosis Date Noted  . Recurrent otitis media of left ear 03/10/2019  . Candidal vaginitis 03/15/2017  . History of placement of ear tubes 02/22/2017  . Left wrist injury 10/22/2015  . Left flank pain 10/22/2015  . 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 Seasonal allergies   . Frequent headaches   . Pelvic mass in female     Past Surgical History:  Procedure Laterality Date  . LAPAROSCOPIC REMOVAL ABDOMINAL MASS Left 2010   paratubular - fallopian tube  . Nexplanon     insertion and removal  . TONSILLECTOMY  2006  . TYMPANOSTOMY TUBE PLACEMENT  1999; 2006   second surgery was permanent ear  tubes  . WRIST SURGERY Left 05/25/2016   flopping tear of LT ligament    Prior to Admission medications   Medication Sig Start Date End Date Taking? Authorizing Provider  Ascorbic Acid (VITAMIN C) 1000 MG tablet Take 1,000 mg by mouth daily.    [provider]  cefdinir (OMNICEF) 300 MG capsule Take 1 capsule (300 mg total) by mouth 2 (two) times daily. 03/18/19   Eustaquio Boyden, MD  cetirizine (ZYRTEC) 10 MG tablet Take 10 mg by mouth as needed for allergies.    [provider]  Cholecalciferol (VITAMIN D3 PO) Take 1 capsule by mouth daily.    [provider]  dicyclomine (BENTYL) 20 MG tablet Take 1 tablet (20 mg total) by mouth 4 (four) times daily as needed for up to 5 days for spasms. 06/10/19 06/15/19  Shaune Pollack, MD  fluconazole (DIFLUCAN) 150 MG tablet TAKE 1 TABLET (150 MG TOTAL) BY MOUTH ONCE FOR 1 DOSE. REPEAT IN 4 DAYS IF NEEDED 03/10/19   [provider]  fluticasone (FLONASE) 50 MCG/ACT nasal spray Place 2 sprays into both nostrils daily as needed for allergies or rhinitis.    [provider]  norethindrone-ethinyl estradiol-iron (LOESTRIN FE) 1.5-30 MG-MCG tablet Take 1 tablet by mouth daily. CONTINUOUS DOSING 01/09/19   Copland, Alicia B, PA-C  ondansetron (ZOFRAN ODT) 4 MG disintegrating tablet Take 1 tablet (4 mg  total) by mouth every 8 (eight) hours as needed for nausea or vomiting. 06/10/19   Duffy Bruce, MD  Probiotic Product (PROBIOTIC-10) CAPS Take by mouth.    [provider]    Allergies Midazolam and Shellfish allergy  Family History  Problem Relation Age of Onset  . Breast cancer Other   . Cancer Paternal Grandmother        thyroid - and great grandfather  . CAD Maternal Grandfather   . Heart disease Paternal Grandfather   . Diabetes Paternal Grandfather   . Other Brother        hypoglycemia from hyperinsulinism  . Stroke Other        maternal great grandmother  . Skin cancer Maternal Aunt 30        Melanoma  . Breast cancer Maternal Grandmother 9    Social History Social History   Tobacco Use  . Smoking status: Never Smoker  . Smokeless tobacco: Never Used  Substance Use Topics  . Alcohol use: Yes    Alcohol/week: 0.0 standard drinks  . Drug use: No    Review of Systems  Review of Systems  Constitutional: Positive for chills and fatigue. Negative for fever.  HENT: Negative for congestion and sore throat.   Eyes: Negative for visual disturbance.  Respiratory: Negative for cough and shortness of breath.   Cardiovascular: Negative for chest pain.  Gastrointestinal: Positive for abdominal pain, diarrhea, nausea and vomiting.  Genitourinary: Negative for flank pain.  Musculoskeletal: Negative for back pain and neck pain.  Skin: Negative for rash and wound.  Neurological: Positive for weakness.  All other systems reviewed and are negative.    ____________________________________________  PHYSICAL EXAM:      VITAL SIGNS: ED Triage Vitals  Enc Vitals Group     BP 06/09/19 2106 (!) 96/53     Pulse Rate 06/09/19 2106 96     Resp 06/09/19 2106 16     Temp 06/09/19 2106 97.7 F (36.5 C)     Temp Source 06/09/19 2106 Oral     SpO2 06/09/19 2106 98 %     Weight 06/09/19 2103 150 lb (68 kg)     Height 06/09/19 2103 5\' 6"  (1.676 m)     Head Circumference --      Peak Flow --      Pain Score 06/09/19 2102 7     Pain Loc --      Pain Edu? --      Excl. in [place]? --      Physical Exam Vitals and nursing note reviewed.  Constitutional:      General: She is not in acute distress.    Appearance: She is well-developed.  HENT:     Head: Normocephalic and atraumatic.  Eyes:     Conjunctiva/sclera: Conjunctivae normal.  Cardiovascular:     Rate and Rhythm: Regular rhythm. Tachycardia present.     Heart sounds: Normal heart sounds. No murmur. No friction rub.  Pulmonary:     Effort: Pulmonary effort is normal. No respiratory distress.     Breath sounds: Normal breath  sounds. No wheezing or rales.  Abdominal:     General: Abdomen is flat. Bowel sounds are increased. There is no distension.     Palpations: Abdomen is soft.     Tenderness: There is abdominal tenderness in the right lower quadrant, periumbilical area and left lower quadrant. There is no guarding or rebound.     Comments: Diffuse TTP, worse with flexion of her  abdomen. No specific TTP in RUQ, RLQ. Mildly greater lower abd ttp than upper abd ttp.  Musculoskeletal:     Cervical back: Neck supple.  Skin:    General: Skin is warm.     Capillary Refill: Capillary refill takes less than 2 seconds.  Neurological:     Mental Status: She is alert and oriented to person, place, and time.     Motor: No abnormal muscle tone.       ____________________________________________   LABS (all labs ordered are listed, but only abnormal results are displayed)  Labs Reviewed  COMPREHENSIVE METABOLIC PANEL - Abnormal; Notable for the following components:      Result Value   Glucose, Bld 154 (*)    BUN 21 (*)    Creatinine, Ser 1.24 (*)    Total Bilirubin 1.8 (*)    All other components within normal limits  CBC - Abnormal; Notable for the following components:   WBC 12.5 (*)    RBC 5.61 (*)    Hemoglobin 18.3 (*)    HCT 53.3 (*)    All other components within normal limits  URINALYSIS, COMPLETE (UACMP) WITH MICROSCOPIC - Abnormal; Notable for the following components:   Color, Urine YELLOW (*)    APPearance CLEAR (*)    Hgb urine dipstick SMALL (*)    Ketones, ur 5 (*)    All other components within normal limits  C DIFFICILE QUICK SCREEN W PCR REFLEX  GI PATHOGEN PANEL BY PCR, STOOL  LIPASE, BLOOD  POC URINE PREG, ED  POCT PREGNANCY, URINE    ____________________________________________  EKG: None ________________________________________  RADIOLOGY All imaging, including plain films, CT scans, and ultrasounds, independently reviewed by me, and interpretations confirmed via formal  radiology reads.  ED MD interpretation:   CT abdomen/pelvis: No acute abnormality  Official radiology report(s): CT ABDOMEN PELVIS W CONTRAST  Result Date: 06/10/2019 CLINICAL DATA:  Nausea and vomiting and abdominal distension EXAM: CT ABDOMEN AND PELVIS WITH CONTRAST TECHNIQUE: Multidetector CT imaging of the abdomen and pelvis was performed using the standard protocol following bolus administration of intravenous contrast. CONTRAST:  OMNIPAQUE IOHEXOL 300 MG/ML  SOLN COMPARISON:  03/02/2012 FINDINGS: Lower chest: No acute abnormality. Hepatobiliary: No focal liver abnormality is seen. No gallstones, gallbladder wall thickening, or biliary dilatation. Pancreas: Unremarkable. No pancreatic ductal dilatation or surrounding inflammatory changes. Spleen: Normal in size without focal abnormality. Adrenals/Urinary Tract: Adrenal glands are unremarkable. Kidneys are normal, without renal calculi, focal lesion, or hydronephrosis. Bladder is unremarkable. Stomach/Bowel: Appendix is within normal limits. No obstructive or inflammatory changes of the colon are seen. Small bowel is within normal limits. Stomach is unremarkable. Vascular/Lymphatic: No significant vascular findings are present. No enlarged abdominal or pelvic lymph nodes. Reproductive: Uterus and bilateral adnexa are unremarkable. Other: No abdominal wall hernia or abnormality. No abdominopelvic ascites. Musculoskeletal: No acute or significant osseous findings. IMPRESSION: No acute abnormality identified. Electronically Signed   By: Alcide Clever M.D.   On: 06/10/2019 02:20    ____________________________________________  PROCEDURES   Procedure(s) performed (including Critical Care):  Procedures  ____________________________________________  INITIAL IMPRESSION / MDM / ASSESSMENT AND PLAN / ED COURSE  As part of my medical decision making, I reviewed the following data within the electronic MEDICAL RECORD NUMBER Nursing notes reviewed  and incorporated, Old chart reviewed, Notes from prior ED visits, and [place] Controlled Substance Database       *Lori Blake was evaluated in Emergency Department on 06/10/2019 for the symptoms described in  the history of present illness. She was evaluated in the context of the global COVID-19 pandemic, which necessitated consideration that the patient might be at risk for infection with the SARS-CoV-2 virus that causes COVID-19. Institutional protocols and algorithms that pertain to the evaluation of patients at risk for COVID-19 are in a state of rapid change based on information released by regulatory bodies including the CDC and federal and state organizations. These policies and algorithms were followed during the patient's care in the ED.  Some ED evaluations and interventions may be delayed as a result of limited staffing during the pandemic.*     Medical Decision Making: Very pleasant 24 year old female here with diffuse abdominal pain, nausea, vomiting, and diarrhea.  She now has bloody diarrhea and blood-tinged emesis.  Suspect this is likely infectious gastroenteritis or colitis.  She did recently take antibiotics but is C. difficile negative.  Lab work shows mild leukocytosis and CMP is consistent with dehydration with slight elevation of creatinine.  She has been given IV fluids.  Given her tenderness on exam and blood, CT scan obtained which fortunately shows no evidence of appendicitis or significant colitis or diverticulitis.  Will send an outpatient GI panel to evaluate for possible E. coli, Shigella, or other invasive enterocolitis, treating supportively in the interim with Zofran, Bentyl, and fluids.  Advised her to hold on antidiarrheals until GI panel comes back.  Will also hold on antibiotics in the setting of possible E. coli infection.  She feels markedly improved in the ED and is tolerating p.o. without difficulty.  ____________________________________________  FINAL CLINICAL  IMPRESSION(S) / ED DIAGNOSES  Final diagnoses:  Diarrhea of presumed infectious origin  Nausea vomiting and diarrhea     MEDICATIONS GIVEN DURING THIS VISIT:  Medications  ondansetron (ZOFRAN-ODT) disintegrating tablet 4 mg (4 mg Oral Given 06/09/19 2115)  lactated ringers bolus 2,000 mL (0 mLs Intravenous Stopped 06/10/19 0315)  ondansetron (ZOFRAN) injection 4 mg (4 mg Intravenous Given 06/09/19 2351)  dicyclomine (BENTYL) capsule 20 mg (20 mg Oral Given 06/09/19 2351)  iohexol (OMNIPAQUE) 300 MG/ML solution 100 mL (100 mLs Intravenous Contrast Given 06/10/19 0206)     ED Discharge Orders         Ordered    ondansetron (ZOFRAN ODT) 4 MG disintegrating tablet  Every 8 hours PRN     06/10/19 0251    dicyclomine (BENTYL) 20 MG tablet  4 times daily PRN     06/10/19 0251           Note:  This document was prepared using Dragon voice recognition software and may include unintentional dictation errors.   Shaune Pollack, MD 06/10/19 [phone]

## 2019-06-09 NOTE — ED Notes (Signed)
Pt reports n/v/d today.  States unable to keep anything down.  Pt able to drink water and take meds without diff.  Pt alert  Mother with pt.

## 2019-06-10 ENCOUNTER — Emergency Department: Payer: BC Managed Care – PPO

## 2019-06-10 ENCOUNTER — Telehealth: Payer: Self-pay

## 2019-06-10 ENCOUNTER — Encounter: Payer: Self-pay | Admitting: Radiology

## 2019-06-10 DIAGNOSIS — R112 Nausea with vomiting, unspecified: Secondary | ICD-10-CM | POA: Diagnosis not present

## 2019-06-10 DIAGNOSIS — R1110 Vomiting, unspecified: Secondary | ICD-10-CM | POA: Diagnosis not present

## 2019-06-10 DIAGNOSIS — R197 Diarrhea, unspecified: Secondary | ICD-10-CM | POA: Diagnosis not present

## 2019-06-10 LAB — URINALYSIS, COMPLETE (UACMP) WITH MICROSCOPIC
Bacteria, UA: NONE SEEN
Bilirubin Urine: NEGATIVE
Glucose, UA: NEGATIVE mg/dL
Ketones, ur: 5 mg/dL — AB
Leukocytes,Ua: NEGATIVE
Nitrite: NEGATIVE
Protein, ur: NEGATIVE mg/dL
Specific Gravity, Urine: 1.027 (ref 1.005–1.030)
pH: 6 (ref 5.0–8.0)

## 2019-06-10 LAB — POCT PREGNANCY, URINE: Preg Test, Ur: NEGATIVE

## 2019-06-10 LAB — C DIFFICILE QUICK SCREEN W PCR REFLEX
C Diff antigen: NEGATIVE
C Diff interpretation: NOT DETECTED
C Diff toxin: NEGATIVE

## 2019-06-10 MED ORDER — ONDANSETRON 4 MG PO TBDP: 4.0000 mg | ORAL_TABLET | Freq: Three times a day (TID) | ORAL | 0 refills | Status: DC | PRN

## 2019-06-10 MED ORDER — DICYCLOMINE HCL 20 MG PO TABS: 20.0000 mg | ORAL_TABLET | Freq: Four times a day (QID) | ORAL | 0 refills | Status: DC | PRN

## 2019-06-10 MED ORDER — KETOROLAC TROMETHAMINE 30 MG/ML IJ SOLN: 15.0000 mg | Freq: Once | INTRAMUSCULAR | Status: DC

## 2019-06-10 MED ORDER — IOHEXOL 300 MG/ML  SOLN
100.0000 mL | Freq: Once | INTRAMUSCULAR | Status: AC | PRN
  Administered 2019-06-10: 100 mL via INTRAVENOUS

## 2019-06-10 NOTE — ED Notes (Signed)
To ct scan

## 2019-06-10 NOTE — Discharge Instructions (Signed)
As we discussed:  Drink at least 6-8 glasses of water daily for the next 2-3 days. Try Gatorade or other sports drink tomorrow to help stay hydrated.  Take the Zofran as needed for nausea  Take Tylenol for pain if needed  For now, do not take anti-diarrheal medications until your GI panel comes back. Some infections can get WORSE with these medications, so it's important to know before taking them.  Wash your hands frequently and do not share drinks with anyone

## 2019-06-10 NOTE — Telephone Encounter (Signed)
[facility] Night - Client TELEPHONE ADVICE RECORD AccessNurse Patient Name: Lori Blake Gender: Female DOB: 1995-06-18 Age: 24 Y 2 M 8 D Return Phone Number: [phone] (Primary) Address[place] [address] Client [facility] Night - Client Client Site [facility] - Night Physician Eustaquio Boyden - MD Contact Type Call Who Is Calling Patient / Member / Family / Caregiver Call Type Triage / Clinical Caller Name [name] Relationship To Patient Mother Return Phone Number [phone] (Primary) Chief Complaint VOMITING - Blood Reason for Call Symptomatic / Request for Health Information Initial Comment Caller states patient has been sick with diarrhea and vomiting. Now there is blood in stool and in vomit. Translation No Nurse Assessment Nurse: [name], RN, [name] Date/Time [name] Time): 06/09/2019 8:04:33 PM Confirm and document reason for call. If symptomatic, describe symptoms. ---Caller states patient has been sick with diarrhea and vomiting. Now there is blood in stool (pure red) and in vomit (dark brown). Has the patient had close contact with a person known or suspected to have the novel coronavirus illness OR traveled / lives in area with major community spread (including international travel) in the last 14 days from the onset of symptoms? * If Asymptomatic, screen for exposure and travel within the last 14 days. ---No Does the patient have any new or worsening symptoms? ---Yes Will a triage be completed? ---Yes Related visit to physician within the last 2 weeks? ---No Does the PT have any chronic conditions? (i.e. diabetes, asthma, this includes High risk factors for pregnancy, etc.) ---No Is the patient pregnant or possibly pregnant? (Ask all females between the ages of 38-55) ---No Is this a behavioral health or substance abuse call? ---No Guidelines Guideline Title  Affirmed Question Affirmed Notes Nurse Date/Time (Eastern Time) Vomiting Blood Shock suspected (e.g., cold/pale/clammy skin, too weak to stand, low BP, rapid pulse) [name], RN, [name] 06/09/2019 8:06:08 PM PLEASE NOTE: All timestamps contained within this report are represented as [place] Standard Time. CONFIDENTIALTY NOTICE: This fax transmission is intended only for the addressee. It contains information that is legally privileged, confidential or otherwise protected from use or disclosure. If you are not the intended recipient, you are strictly prohibited from reviewing, disclosing, copying using or disseminating any of this information or taking any action in reliance on or regarding this information. If you have received this fax in error, please notify us immediately by telephone so that we can arrange for its return to [place]. Phone: [phone], Toll-Free: [phone], Fax: [phone] Page: 2 of 2 Call Id: [id number] Disp. Time [name] Time) Disposition Final User 06/09/2019 8:02:23 PM Send to Urgent Gigi Gin 06/09/2019 8:09:36 PM 911 Outcome Documentation [name], RN, [name] Reason: Advised 911 but states it will be quicker to drive themselves. 06/09/2019 8:10:04 PM Call EMS 911 Now Yes [name], RN, [name] Disagree/Comply Disagree Caller Understands Yes PreDisposition Did not know what to do Care Advice Given Per Guideline CALL EMS 911 NOW: * Immediate medical attention is needed. You need to hang up and call 911 (or an ambulance). * Triager Discretion: I'll call you back in a few minutes to be sure you were able to reach them. Referrals [facility] - ED

## 2019-06-10 NOTE — Telephone Encounter (Signed)
Per chart review tab pt seen at [facility] ED on 06/09/19.

## 2019-06-10 NOTE — Telephone Encounter (Signed)
Agree with ER eval.

## 2019-06-10 NOTE — ED Notes (Signed)
Poct pregnancy Negative 

## 2019-06-11 LAB — GI PATHOGEN PANEL BY PCR, STOOL

## 2019-06-12 NOTE — Telephone Encounter (Signed)
Left message on vm per dpr relaying Dr. G's message.  

## 2019-06-12 NOTE — Telephone Encounter (Signed)
Noted. Recommend continued efforts at good hydration with small sips of water, gatorade, gingerale, etc.

## 2019-06-12 NOTE — Telephone Encounter (Signed)
Lvm asking pt to call back.  Need to relay Dr. G's message and get update on pt.  

## 2019-06-12 NOTE — Telephone Encounter (Signed)
Returned pt's call.  I relayed Dr. Timoteo Expose message.  Pt states in general she is feeling better.  No more diarrhea, nausea/vomiting is improving but still has bad HA.

## 2019-06-12 NOTE — Telephone Encounter (Signed)
Seen at ER with normal CT scan, tested positive for norovirus. Seems she had severe viral norovirus gastroenteritis. plz call for update- how is she doing today? Should see some improvement each day

## 2019-06-12 NOTE — Telephone Encounter (Signed)
Patient is returning your call.  

## 2019-06-27 ENCOUNTER — Other Ambulatory Visit: Payer: Self-pay

## 2019-06-27 ENCOUNTER — Ambulatory Visit: Payer: BC Managed Care – PPO | Attending: Internal Medicine

## 2019-06-27 DIAGNOSIS — Z23 Encounter for immunization: Secondary | ICD-10-CM

## 2019-06-27 NOTE — Progress Notes (Signed)
   Covid-19 Vaccination Clinic  Name:  Lori Blake    MRN: [id number] DOB: [birth year]  06/27/2019  [name] was observed post Covid-19 immunization for 15 minutes without incident. She was provided with Vaccine Information Sheet and instruction to access the V-Safe system.   [name] was instructed to call 911 with any severe reactions post vaccine: Marland Kitchen Difficulty breathing  . Swelling of face and throat  . A fast heartbeat  . A bad rash all over body  . Dizziness and weakness   Immunizations Administered    Name Date Dose VIS Date Route   Pfizer COVID-19 Vaccine 06/27/2019  8:15 AM 0.3 mL 02/21/2019 Intramuscular   Manufacturer: [employer], [employer]   Lot: [id number]   NDC: [address]-7

## 2019-07-22 ENCOUNTER — Ambulatory Visit: Payer: BC Managed Care – PPO | Attending: Internal Medicine

## 2019-07-22 DIAGNOSIS — Z23 Encounter for immunization: Secondary | ICD-10-CM

## 2019-07-22 NOTE — Progress Notes (Signed)
   Covid-19 Vaccination Clinic  Name:  Lori Blake    MRN: [id number] DOB: 1996/02/28  07/22/2019  Ms. Lori Blake was observed post Covid-19 immunization for 15 minutes without incident. She was provided with Vaccine Information Sheet and instruction to access the V-Safe system.   Ms. Lori Blake was instructed to call 911 with any severe reactions post vaccine: Marland Kitchen Difficulty breathing  . Swelling of face and throat  . A fast heartbeat  . A bad rash all over body  . Dizziness and weakness   Immunizations Administered    Name Date Dose VIS Date Route   Pfizer COVID-19 Vaccine 07/22/2019  8:45 AM 0.3 mL 05/07/2018 Intramuscular   Manufacturer: [employer], [employer]   Lot: [id number]   NDC: [address]-5

## 2019-12-18 ENCOUNTER — Other Ambulatory Visit: Payer: Self-pay | Admitting: Obstetrics and Gynecology

## 2019-12-18 DIAGNOSIS — Z3041 Encounter for surveillance of contraceptive pills: Secondary | ICD-10-CM

## 2020-01-09 ENCOUNTER — Other Ambulatory Visit: Payer: Self-pay

## 2020-01-09 DIAGNOSIS — Z3041 Encounter for surveillance of contraceptive pills: Secondary | ICD-10-CM

## 2020-01-09 MED ORDER — NORETHIN ACE-ETH ESTRAD-FE 1.5-30 MG-MCG PO TABS: 1.0000 | ORAL_TABLET | Freq: Every day | ORAL | 0 refills | Status: AC

## 2020-02-01 ENCOUNTER — Other Ambulatory Visit: Payer: Self-pay | Admitting: Obstetrics and Gynecology

## 2020-02-01 DIAGNOSIS — Z3041 Encounter for surveillance of contraceptive pills: Secondary | ICD-10-CM

## 2020-04-15 ENCOUNTER — Telehealth: Payer: Self-pay | Admitting: Nurse Practitioner

## 2020-04-15 ENCOUNTER — Other Ambulatory Visit: Payer: Self-pay

## 2020-04-15 DIAGNOSIS — H6690 Otitis media, unspecified, unspecified ear: Secondary | ICD-10-CM

## 2020-04-15 MED ORDER — FLUCONAZOLE 150 MG PO TABS: 150.0000 mg | ORAL_TABLET | Freq: Once | ORAL | 0 refills | Status: AC

## 2020-04-15 MED ORDER — AMOXICILLIN-POT CLAVULANATE 875-125 MG PO TABS: 1.0000 | ORAL_TABLET | Freq: Two times a day (BID) | ORAL | 0 refills | Status: DC

## 2020-04-15 NOTE — Progress Notes (Signed)
   Subjective:    Patient ID: Lori Blake, female    DOB: [birth year], 25 y.o.   MRN: [id number]  HPI  24 year old female presenting to [facility] presenting with congestion in her ears for the past 2 weeks. She has been using sudafed OTC for relief. For the past two days her ears have become painful which she identifies with the ear infections she has had in the past, she has also had sinus congestion consistently for the past 2 weeks.   She has a history of frequent ear infections  She has tubes in place currently, has seen an ENT most recently 2 years ago, was assessed last one year ago for an acute ear infection and tubes were still in place at that time.  She has had good success with Augmentin in the past, no SEs  Does note she gets frequent yeast infections when using antibiotics and has used diflucan for relief in the past   Patient consents to telehealth appointment Review of Systems  Constitutional: Negative.   HENT: Positive for ear pain and sinus pressure.   Respiratory: Negative.   Cardiovascular: Negative.   Genitourinary: Negative.   Neurological: Negative.        Objective:   Physical Exam  This was a telehealth appointment with patient so no physical exam was performed, patient was in no acute distress during phone conversation with provider. Patient was a good historian.       Assessment & Plan:  Patient may continue decongestant to aid is drying up mucous, continue allergy regimen.  Advised her to seek an in person assessment and evaluation if ear pain persists beyond 48 hours on antibiotics- to assess status of tubes and ears  Also advised a f/u with ENT to assess status of tubes when illness resolves  May use advil or tylenol for pain relief   Will provide diflucan prescription if yeast symptoms develop   Meds ordered this encounter  Medications  . amoxicillin-clavulanate (AUGMENTIN) 875-125 MG tablet    Sig: Take 1 tablet by mouth 2 (two) times daily.  Take with food    Dispense:  20 tablet    Refill:  0  . fluconazole (DIFLUCAN) 150 MG tablet    Sig: Take 1 tablet (150 mg total) by mouth once for 1 dose.    Dispense:  1 tablet    Refill:  0

## 2020-04-23 ENCOUNTER — Telehealth: Payer: Self-pay | Admitting: Medical

## 2020-04-23 ENCOUNTER — Other Ambulatory Visit: Payer: Self-pay

## 2020-04-23 DIAGNOSIS — B3731 Acute candidiasis of vulva and vagina: Secondary | ICD-10-CM

## 2020-04-23 DIAGNOSIS — H6505 Acute serous otitis media, recurrent, left ear: Secondary | ICD-10-CM

## 2020-04-23 DIAGNOSIS — B373 Candidiasis of vulva and vagina: Secondary | ICD-10-CM

## 2020-04-23 MED ORDER — CEFDINIR 300 MG PO CAPS: 300.0000 mg | ORAL_CAPSULE | Freq: Two times a day (BID) | ORAL | 0 refills | Status: AC

## 2020-04-23 MED ORDER — FLUCONAZOLE 150 MG PO TABS: ORAL_TABLET | ORAL | 0 refills | Status: AC

## 2020-04-23 NOTE — Patient Instructions (Signed)
Otitis Media, Adult  Otitis media is a condition in which the middle ear is red and swollen (inflamed) and full of fluid. The middle ear is the part of the ear that contains bones for hearing as well as air that helps send sounds to the brain. The condition usually goes away on its own. What are the causes? This condition is caused by a blockage in the eustachian tube. The eustachian tube connects the middle ear to the back of the nose. It normally allows air into the middle ear. The blockage is caused by fluid or swelling. Problems that can cause blockage include:  A cold or infection that affects the nose, mouth, or throat.  Allergies.  An irritant, such as tobacco smoke.  Adenoids that have become large. The adenoids are soft tissue located in the back of the throat, behind the nose and the roof of the mouth.  Growth or swelling in the upper part of the throat, just behind the nose (nasopharynx).  Damage to the ear caused by change in pressure. This is called barotrauma. What are the signs or symptoms? Symptoms of this condition include:  Ear pain.  Fever.  Problems with hearing.  Being tired.  Fluid leaking from the ear.  Ringing in the ear. How is this treated? This condition can go away on its own within 3-5 days. But if the condition is caused by bacteria or does not go away on its own, or if it keeps coming back, your doctor may:  Give you antibiotic medicines.  Give you medicines for pain. Follow these instructions at home:  Take over-the-counter and prescription medicines only as told by your doctor.  If you were prescribed an antibiotic medicine, take it as told by your doctor. Do not stop taking the antibiotic even if you start to feel better.  Keep all follow-up visits as told by your doctor. This is important. Contact a doctor if:  You have bleeding from your nose.  There is a lump on your neck.  You are not feeling better in 5 days.  You feel worse  instead of better. Get help right away if:  You have pain that is not helped with medicine.  You have swelling, redness, or pain around your ear.  You get a stiff neck.  You cannot move part of your face (paralysis).  You notice that the bone behind your ear hurts when you touch it.  You get a very bad headache. Summary  Otitis media means that the middle ear is red, swollen, and full of fluid.  This condition usually goes away on its own.  If the problem does not go away, treatment may be needed. You may be given medicines to treat the infection or to treat your pain.  If you were prescribed an antibiotic medicine, take it as told by your doctor. Do not stop taking the antibiotic even if you start to feel better.  Keep all follow-up visits as told by your doctor. This is important. This information is not intended to replace advice given to you by your health care provider. Make sure you discuss any questions you have with your health care provider. Document Revised: 01/30/2019 Document Reviewed: 01/30/2019 Elsevier Patient Education  2021 [employer]  

## 2020-04-23 NOTE — Progress Notes (Signed)
   Subjective:    Patient ID: Lori Blake, female    DOB: [birth year], 25 y.o.   MRN: [id number]  HPI 25 yo female in non acute distress , consents to telemedicine appointment.  Left ear continues to bother patient after being treated with Augmentin. She thought initially her ears got better but then the left ear started hurting again.  In Jan 2021 she saw her  PCP starated on Augmentin then had to be treated with [facility]. Which did seem to clear up her last infection seh states..   Allergies  Allergen Reactions  . Midazolam Other (See Comments)    Paradoxical reaction paradox reaction  . Shellfish Allergy     Allergy at  25 yo and seafood was positive allergy.     Review of Systems  Constitutional: Negative for chills, fatigue and fever.  HENT: Positive for congestion and ear pain. Negative for sore throat.   Respiratory: Negative for cough and shortness of breath.   Cardiovascular: Negative for chest pain.  Gastrointestinal: Negative for abdominal pain and diarrhea.  Neurological: Negative for headaches.   History of tubes in her ears currently per patient.    Objective:   Physical Exam  AXOX3 No physical exam preformed due to telemedicine appointment     Assessment & Plan:  Left ear infection- serous Recurrent.  Yeast vaginitis secondary to antibiotics. If antibiotics do no solve her infection I will refer her to ENT MD. Meds ordered this encounter  Medications  . fluconazole (DIFLUCAN) 150 MG tablet    Sig: Today one tablet today, Then repeat in 10 days after finishing antibiotics.    Dispense:  1 tablet    Refill:  0  . cefdinir (OMNICEF) 300 MG capsule    Sig: Take 1 capsule (300 mg total) by mouth 2 (two) times daily.    Dispense:  20 capsule    Refill:  0  Call if not improving in  3-5 days ( patient is  Visiting home [place] in [place] Patient verbalizes understanding and has no questions at discharge.

## 2020-12-14 ENCOUNTER — Telehealth: Payer: Self-pay

## 2020-12-14 NOTE — Telephone Encounter (Signed)
Pt calling; 1 1/2 wks ago developed a yeast inf; had a left over diflucan which she took; sxs went away for a few days then came back in full force; did 1d tx of monistat; pkg said sxs would go away within 3d but they haven't let up yet.  What is next step?  [phone]  Adv pt she needs to be seen and is overdue for her annual; pt states she is currently in [place] was there adv over the phone I could give her; adv the only thing would be to try either 3d or 7d monistat and if that doesn't work to be seen.
